# Patient Record
Sex: Female | Born: 1945 | Race: White | Hispanic: No | Marital: Single | Smoking: Never smoker
Health system: Southern US, Community
[De-identification: ages and names within clinical notes are randomized; demographics above are authoritative.]

## PROBLEM LIST (undated history)

## (undated) DIAGNOSIS — Z8639 Personal history of other endocrine, nutritional and metabolic disease: Secondary | ICD-10-CM

## (undated) DIAGNOSIS — R079 Chest pain, unspecified: Secondary | ICD-10-CM

## (undated) HISTORY — PX: DOPPLER ECHOCARDIOGRAPHY: SHX263

## (undated) HISTORY — PX: ABDOMINAL HYSTERECTOMY: SHX81

## (undated) HISTORY — PX: CHOLECYSTECTOMY: SHX55

## (undated) HISTORY — DX: Chest pain, unspecified: R07.9

## (undated) HISTORY — PX: APPENDECTOMY: SHX54

---

## 1997-10-30 ENCOUNTER — Ambulatory Visit (HOSPITAL_COMMUNITY): Admission: RE | Admit: 1997-10-30 | Discharge: 1997-10-30 | Payer: Self-pay | Admitting: Gastroenterology

## 1999-08-21 ENCOUNTER — Ambulatory Visit (HOSPITAL_BASED_OUTPATIENT_CLINIC_OR_DEPARTMENT_OTHER): Admission: RE | Admit: 1999-08-21 | Discharge: 1999-08-22 | Payer: Self-pay | Admitting: Orthopedic Surgery

## 2000-01-22 ENCOUNTER — Encounter: Payer: Self-pay | Admitting: Family Medicine

## 2000-01-22 ENCOUNTER — Encounter: Admission: RE | Admit: 2000-01-22 | Discharge: 2000-01-22 | Payer: Self-pay | Admitting: Family Medicine

## 2000-05-20 ENCOUNTER — Encounter: Admission: RE | Admit: 2000-05-20 | Discharge: 2000-05-20 | Payer: Self-pay | Admitting: Family Medicine

## 2000-05-20 ENCOUNTER — Encounter: Payer: Self-pay | Admitting: Family Medicine

## 2000-08-16 ENCOUNTER — Other Ambulatory Visit: Admission: RE | Admit: 2000-08-16 | Discharge: 2000-08-16 | Payer: Self-pay | Admitting: Family Medicine

## 2000-09-10 ENCOUNTER — Encounter: Admission: RE | Admit: 2000-09-10 | Discharge: 2000-09-10 | Payer: Self-pay | Admitting: Family Medicine

## 2000-09-10 ENCOUNTER — Encounter: Payer: Self-pay | Admitting: Family Medicine

## 2002-01-27 ENCOUNTER — Ambulatory Visit (HOSPITAL_COMMUNITY): Admission: RE | Admit: 2002-01-27 | Discharge: 2002-01-27 | Payer: Self-pay

## 2002-06-30 ENCOUNTER — Other Ambulatory Visit: Admission: RE | Admit: 2002-06-30 | Discharge: 2002-06-30 | Payer: Self-pay | Admitting: Family Medicine

## 2002-07-05 ENCOUNTER — Encounter: Payer: Self-pay | Admitting: Family Medicine

## 2002-07-05 ENCOUNTER — Encounter: Admission: RE | Admit: 2002-07-05 | Discharge: 2002-07-05 | Payer: Self-pay | Admitting: Family Medicine

## 2002-08-29 ENCOUNTER — Emergency Department (HOSPITAL_COMMUNITY): Admission: EM | Admit: 2002-08-29 | Discharge: 2002-08-29 | Payer: Self-pay | Admitting: Emergency Medicine

## 2002-12-04 ENCOUNTER — Encounter: Payer: Self-pay | Admitting: Emergency Medicine

## 2002-12-04 ENCOUNTER — Emergency Department (HOSPITAL_COMMUNITY): Admission: EM | Admit: 2002-12-04 | Discharge: 2002-12-04 | Payer: Self-pay | Admitting: Emergency Medicine

## 2003-06-19 ENCOUNTER — Encounter (INDEPENDENT_AMBULATORY_CARE_PROVIDER_SITE_OTHER): Payer: Self-pay | Admitting: Specialist

## 2003-06-19 ENCOUNTER — Ambulatory Visit (HOSPITAL_COMMUNITY): Admission: RE | Admit: 2003-06-19 | Discharge: 2003-06-19 | Payer: Self-pay

## 2004-02-22 ENCOUNTER — Encounter: Admission: RE | Admit: 2004-02-22 | Discharge: 2004-02-22 | Payer: Self-pay | Admitting: Orthopedic Surgery

## 2004-07-08 ENCOUNTER — Encounter: Admission: RE | Admit: 2004-07-08 | Discharge: 2004-07-08 | Payer: Self-pay | Admitting: Orthopedic Surgery

## 2004-09-06 ENCOUNTER — Inpatient Hospital Stay (HOSPITAL_COMMUNITY): Admission: EM | Admit: 2004-09-06 | Discharge: 2004-09-09 | Payer: Self-pay | Admitting: Emergency Medicine

## 2004-09-07 ENCOUNTER — Ambulatory Visit: Payer: Self-pay | Admitting: Oncology

## 2004-09-12 ENCOUNTER — Ambulatory Visit (HOSPITAL_COMMUNITY): Admission: RE | Admit: 2004-09-12 | Discharge: 2004-09-12 | Payer: Self-pay | Admitting: *Deleted

## 2005-06-29 ENCOUNTER — Encounter: Admission: RE | Admit: 2005-06-29 | Discharge: 2005-06-29 | Payer: Self-pay | Admitting: Orthopedic Surgery

## 2005-08-07 ENCOUNTER — Encounter: Admission: RE | Admit: 2005-08-07 | Discharge: 2005-08-07 | Payer: Self-pay | Admitting: Orthopaedic Surgery

## 2006-04-23 ENCOUNTER — Ambulatory Visit (HOSPITAL_COMMUNITY): Admission: RE | Admit: 2006-04-23 | Discharge: 2006-04-23 | Payer: Self-pay | Admitting: Family Medicine

## 2006-05-03 IMAGING — US US ABDOMEN COMPLETE
1 series · 14 of 25 positions shown · non-contrast
Comparison: none

CLINICAL DATA: Abdominal pain. 
 ABDOMEN ULTRASOUND:
TECHNIQUE: Complete abdominal ultrasound examination was performed including evaluation of the liver, gallbladder, bile ducts, pancreas, kidneys, spleen, IVC, and abdominal aorta.

[Series 1: unknown · 0.32mm/px · 14 of 82 slices shown]
[im 1/82]
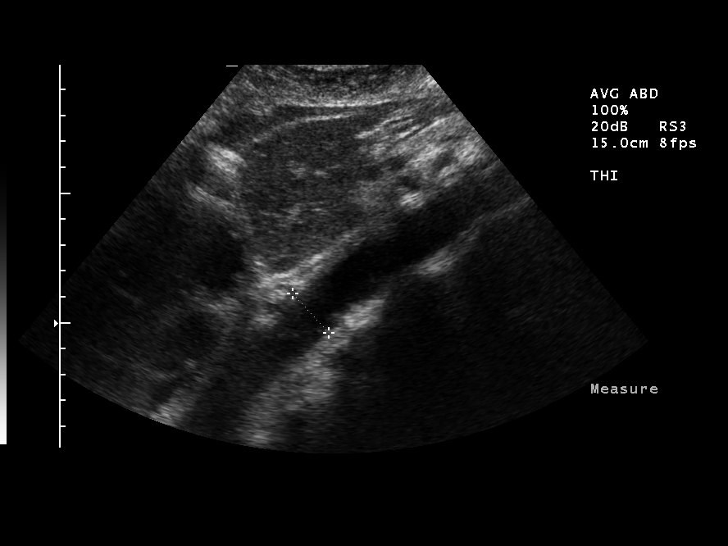
[im 7/82]
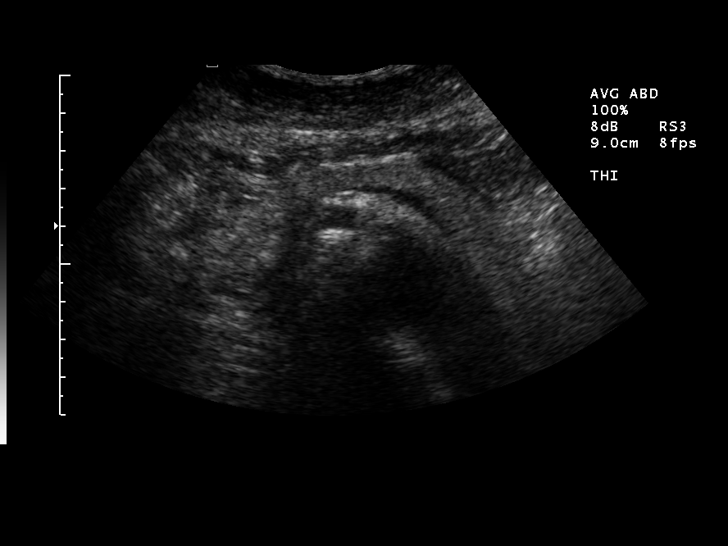
[im 14/82]
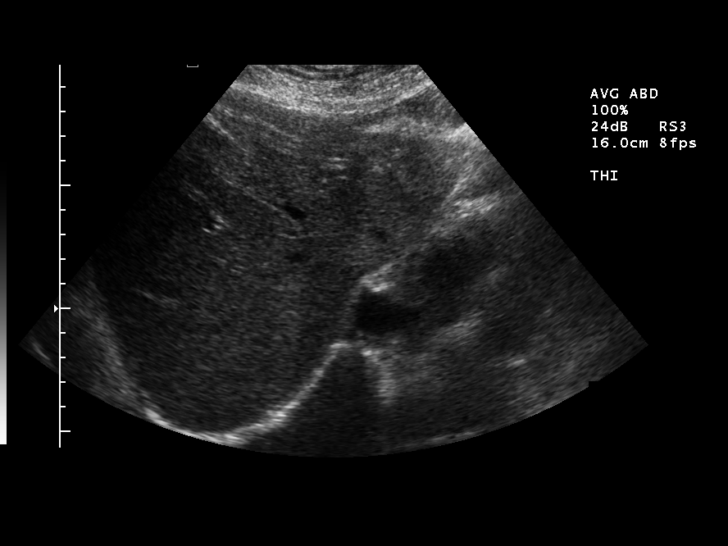
[im 21/82]
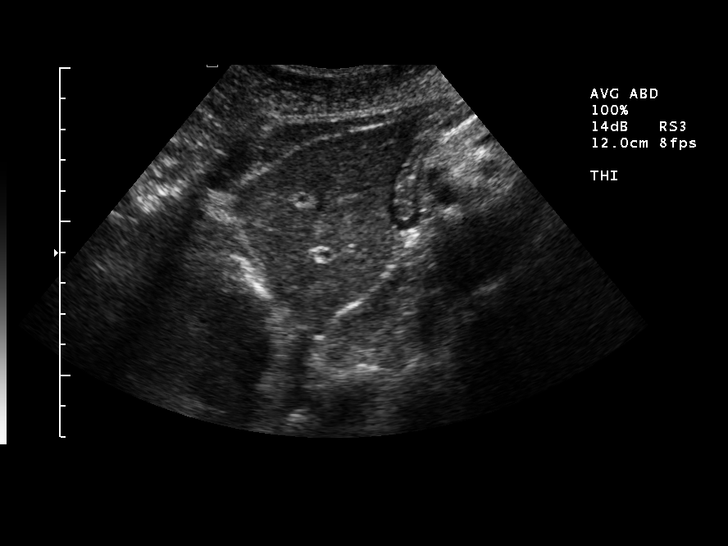
[im 28/82]
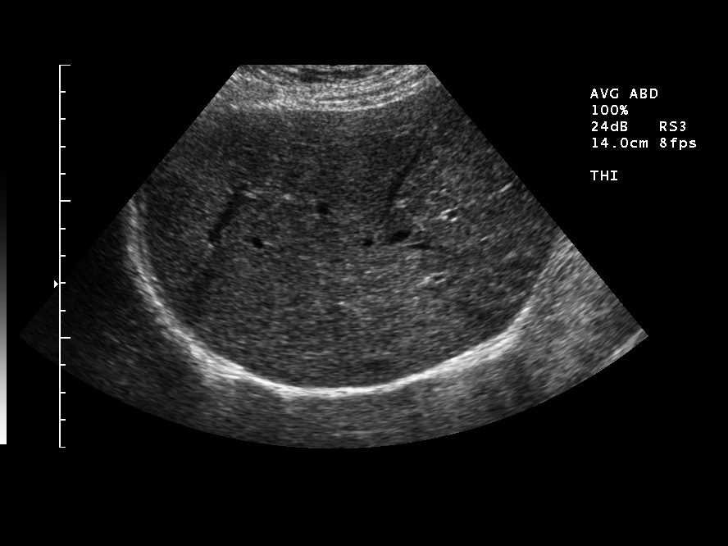
[im 31/82]
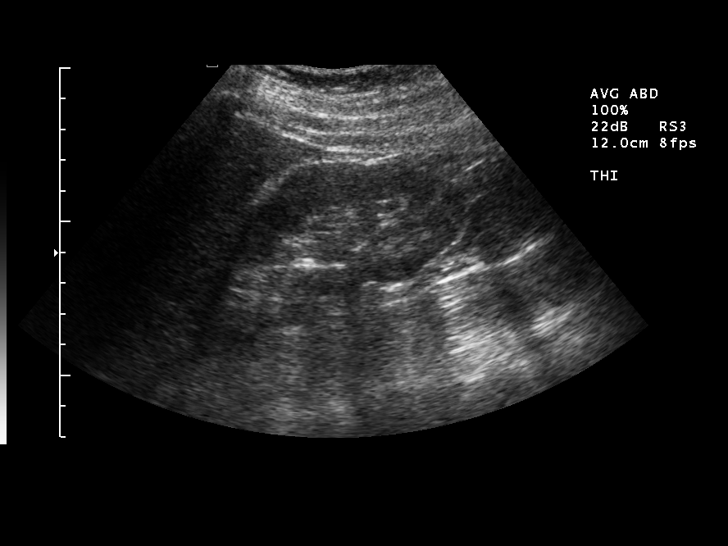
[im 38/82]
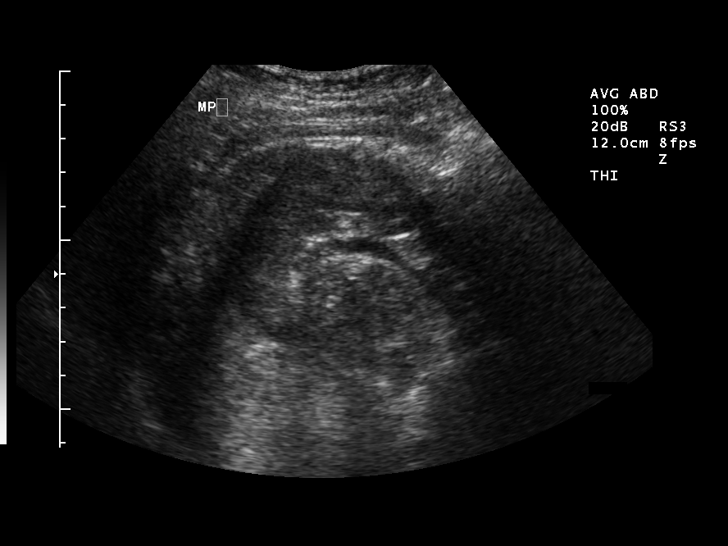
[im 44/82]
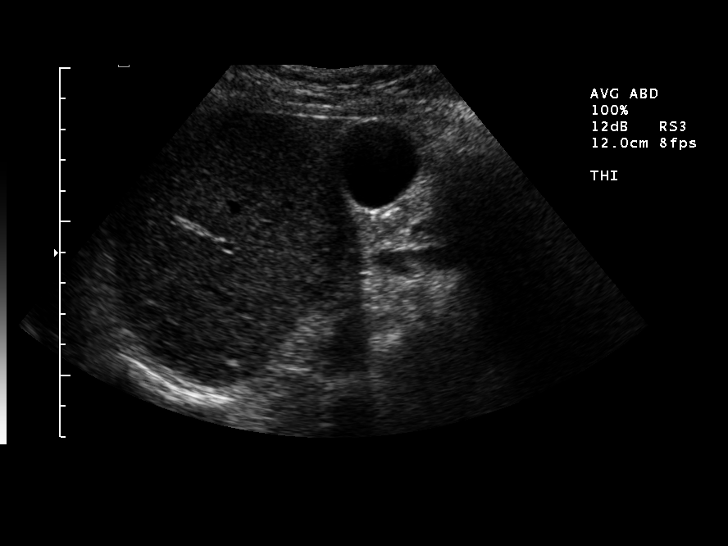
[im 51/82]
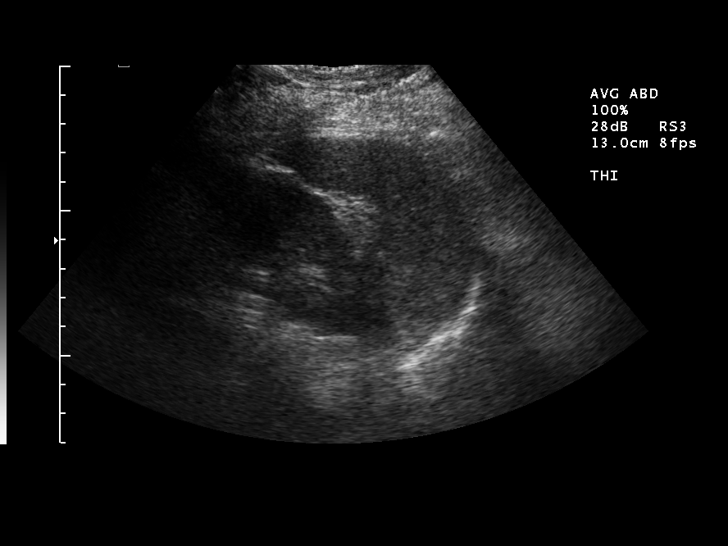
[im 55/82]
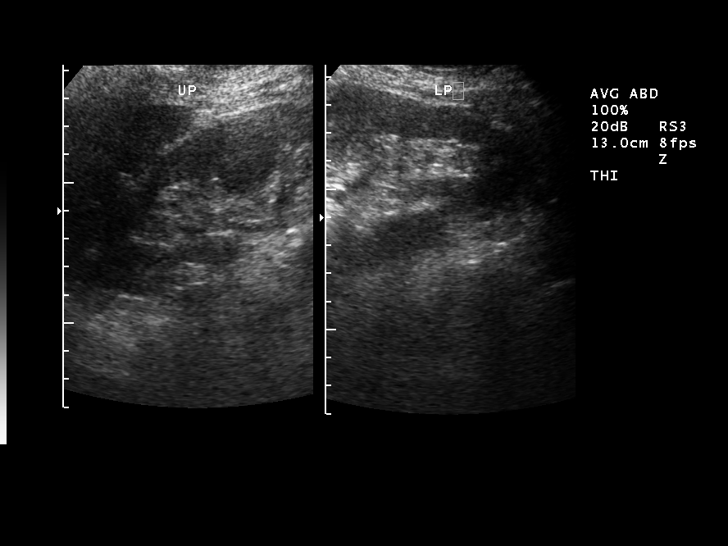
[im 61/82]
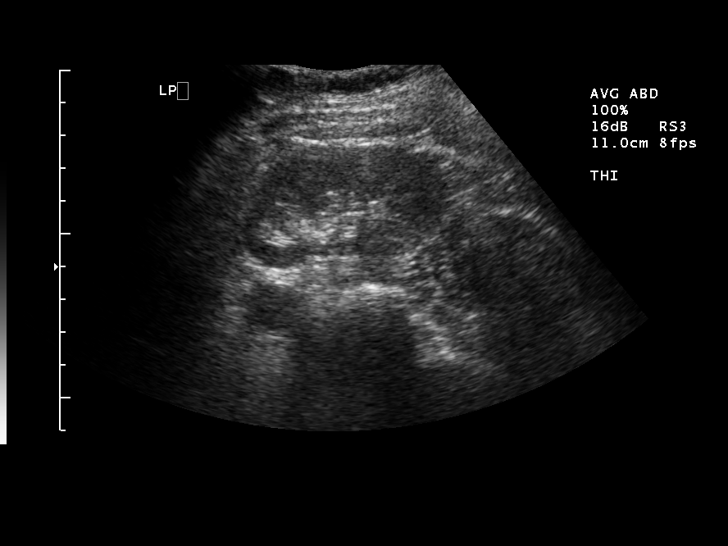
[im 68/82]
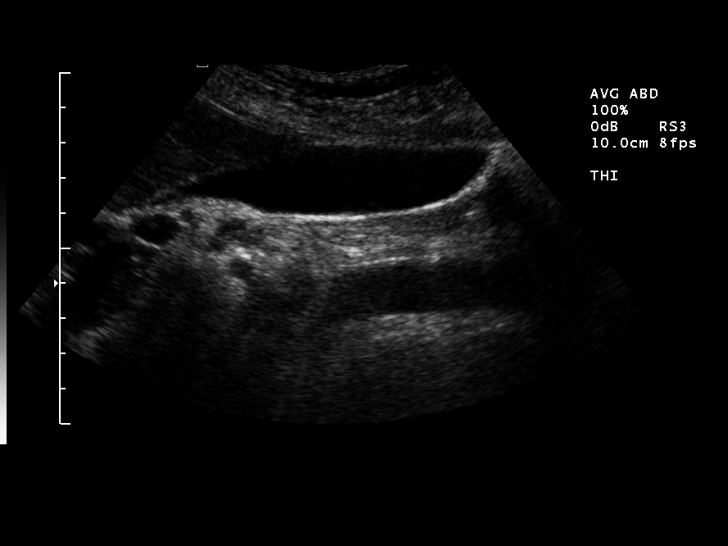
[im 75/82]
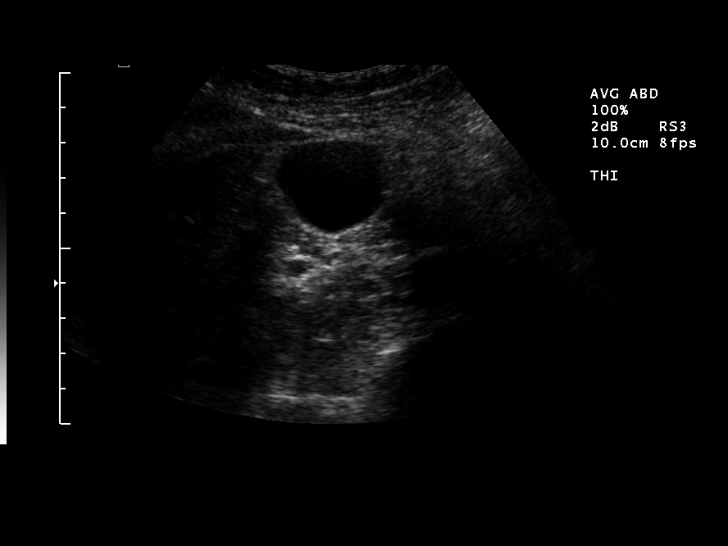
[im 82/82]
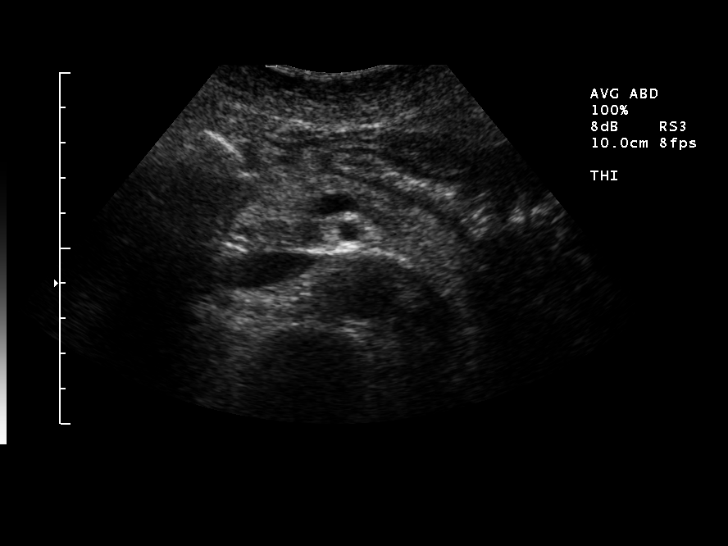

[14 of 25 positions shown; findings below may reference images not displayed]

FINDINGS: There is no evidence of gallstones or biliary ductal dilatation.  The liver is within normal limits in echogenicity, and no focal liver lesions are seen.  The visualized portions of the IVC and pancreas are unremarkable.
 There is no evidence of splenomegaly.  The kidneys are unremarkable, and there is no evidence of hydronephrosis.  The abdominal aorta is non-dilated.
IMPRESSION: Negative abdominal ultrasound.

## 2007-04-07 HISTORY — PX: OTHER SURGICAL HISTORY: SHX169

## 2007-08-27 ENCOUNTER — Encounter: Admission: RE | Admit: 2007-08-27 | Discharge: 2007-08-27 | Payer: Self-pay | Admitting: Family Medicine

## 2009-01-04 ENCOUNTER — Encounter: Admission: RE | Admit: 2009-01-04 | Discharge: 2009-01-04 | Payer: Self-pay | Admitting: Anesthesiology

## 2009-01-11 ENCOUNTER — Encounter: Admission: RE | Admit: 2009-01-11 | Discharge: 2009-01-11 | Payer: Self-pay | Admitting: Cardiology

## 2009-01-16 ENCOUNTER — Ambulatory Visit (HOSPITAL_COMMUNITY): Admission: RE | Admit: 2009-01-16 | Discharge: 2009-01-16 | Payer: Self-pay | Admitting: Internal Medicine

## 2009-01-16 HISTORY — PX: CARDIAC CATHETERIZATION: SHX172

## 2009-08-08 ENCOUNTER — Ambulatory Visit: Payer: Self-pay | Admitting: Gynecology

## 2009-08-09 ENCOUNTER — Encounter: Admission: RE | Admit: 2009-08-09 | Discharge: 2009-08-09 | Payer: Self-pay | Admitting: Orthopedic Surgery

## 2009-08-12 ENCOUNTER — Ambulatory Visit: Payer: Self-pay | Admitting: Gynecology

## 2009-08-14 ENCOUNTER — Ambulatory Visit: Payer: Self-pay | Admitting: Gynecology

## 2009-09-10 ENCOUNTER — Telehealth (INDEPENDENT_AMBULATORY_CARE_PROVIDER_SITE_OTHER): Payer: Self-pay | Admitting: *Deleted

## 2010-03-30 ENCOUNTER — Encounter: Payer: Self-pay | Admitting: Gastroenterology

## 2010-03-30 ENCOUNTER — Encounter: Payer: Self-pay | Admitting: Family Medicine

## 2010-04-08 NOTE — Progress Notes (Signed)
Summary: Dionisio David to Accept   Phone Note Outgoing Call Call back at [facility] Phone [phone]   Call placed by: Harlow Mares CMA Duncan Dull),  September 10, 2009 10:57 AM Call placed to: Patient Summary of Call: Called pt and advised her at this time Dr. Juanda Chance declined to accept the patient into her practice. Patient has seen Dr. Madilyn Fireman, Bosie Clos, and [place]. Patient will come pick up her records and see if a GI in [place] will accept her.  Initial call taken by: Harlow Mares CMA (AAMA),  September 10, 2009 10:59 AM

## 2010-04-29 ENCOUNTER — Encounter (HOSPITAL_COMMUNITY)
Admission: RE | Admit: 2010-04-29 | Discharge: 2010-04-29 | Disposition: A | Discharge status: Home / Self Care | Payer: PRIVATE HEALTH INSURANCE | Source: Ambulatory Visit | Attending: Orthopedic Surgery | Admitting: Orthopedic Surgery

## 2010-04-29 ENCOUNTER — Other Ambulatory Visit (HOSPITAL_COMMUNITY): Payer: Self-pay | Admitting: Orthopedic Surgery

## 2010-04-29 DIAGNOSIS — Z01818 Encounter for other preprocedural examination: Secondary | ICD-10-CM | POA: Insufficient documentation

## 2010-04-29 DIAGNOSIS — Z01811 Encounter for preprocedural respiratory examination: Secondary | ICD-10-CM

## 2010-04-29 DIAGNOSIS — Z01812 Encounter for preprocedural laboratory examination: Secondary | ICD-10-CM | POA: Insufficient documentation

## 2010-04-29 LAB — URINALYSIS, ROUTINE W REFLEX MICROSCOPIC
Ketones, ur: NEGATIVE mg/dL
Specific Gravity, Urine: 1.016 (ref 1.005–1.030)
Urobilinogen, UA: 0.2 mg/dL (ref 0.0–1.0)

## 2010-04-29 LAB — COMPREHENSIVE METABOLIC PANEL
ALT: 11 U/L (ref 0–35)
Alkaline Phosphatase: 97 U/L (ref 39–117)
CO2: 26 mEq/L (ref 19–32)
Chloride: 107 mEq/L (ref 96–112)
Creatinine, Ser: 0.92 mg/dL (ref 0.4–1.2)
GFR calc Af Amer: >60 mL/min (ref >60)
Sodium: 140 mEq/L (ref 135–145)
Total Bilirubin: 0.3 mg/dL (ref 0.3–1.2)

## 2010-04-29 LAB — CBC
MCHC: 34.5 g/dL (ref 30.0–36.0)
Platelets: 199 10*3/uL (ref 150–400)
RDW: 12.7 % (ref 11.5–15.5)

## 2010-04-29 LAB — PROTIME-INR: INR: 0.93 (ref 0.00–1.49)

## 2010-04-29 LAB — APTT: aPTT: 30 seconds (ref 24–37)

## 2010-05-05 ENCOUNTER — Ambulatory Visit (HOSPITAL_COMMUNITY): Payer: PRIVATE HEALTH INSURANCE

## 2010-05-05 ENCOUNTER — Ambulatory Visit (HOSPITAL_COMMUNITY)
Admission: RE | Admit: 2010-05-05 | Discharge: 2010-05-05 | Disposition: A | Discharge status: Home / Self Care | Payer: PRIVATE HEALTH INSURANCE | Source: Ambulatory Visit | Attending: Orthopedic Surgery | Admitting: Orthopedic Surgery

## 2010-05-05 DIAGNOSIS — M1610 Unilateral primary osteoarthritis, unspecified hip: Secondary | ICD-10-CM | POA: Insufficient documentation

## 2010-05-05 DIAGNOSIS — M169 Osteoarthritis of hip, unspecified: Secondary | ICD-10-CM | POA: Insufficient documentation

## 2010-05-05 DIAGNOSIS — I10 Essential (primary) hypertension: Secondary | ICD-10-CM | POA: Insufficient documentation

## 2010-05-09 NOTE — Op Note (Signed)
  [name], [name]                   ACCOUNT NO.:  [id number]  MEDICAL RECORD NO.:  [id number]           PATIENT TYPE:  O  LOCATION:  SDSC                         FACILITY:  [facility]  PHYSICIAN:  Loreta Ave, M.D. DATE OF BIRTH:  07-20-1945  DATE OF PROCEDURE:  05/05/2010 DATE OF DISCHARGE:  05/05/2010                              OPERATIVE REPORT   PREOPERATIVE DIAGNOSIS:  Right hip moderate degenerative arthritis with questionable labrum tear.  POSTOPERATIVE DIAGNOSES:  Right hip moderate degenerative arthritis with questionable labrum tear with some grade II and III changes, especially anterior and tearing of the superior and anterior labrum.  PROCEDURE:  Right hip examined under anesthesia, arthroscopy with chondroplasty, removal of loose bodies and debridement of labrum.  SURGEON:  Loreta Ave, M.D.  ASSISTANT:  Genene Churn. Barry Dienes, P.A. present throughout the entire case and necessary for timely completion of procedure.  ANESTHESIA:  General  BLOOD LOSS:  Minimal.  SPECIMEN:  None.  CULTURES:  None.  COMPLICATIONS:  None.  DRESSING:  Soft compressive.  PROCEDURE:  The patient was brought to the operating room and after adequate anesthesia had been obtained placed on the fracture table, lateral position, appropriate padding and support.  I confirmed that I could distract the hip adequately with arthroscopy.  Traction removed. Prepped and draped in usual sterile fashion.  We then sequentially placed sufficient traction to open up the hip 1 cm, confirmed with fluoroscopic guidance.  Two portals, anterolateral and posterolateral. Utilizing first a spinal needle, then a guidewire, then an enlarging trocar, 2 cannulas were placed into the hip.  Looking from both sides and switching the shaver from the front to the back as well as the camera, I could visualize the entire hip.  Chondral loose bodies and debris removed.  Most of the acetabulum looked good in the back  and medial.  Anterior superior some grade II and III changes, loose bodies. Infolding tearing of the anterior labrum as well as the anterosuperior. Debrided back to stable surface.  All loose fragments removed.  Entire hip examined.  No other findings appreciated.  Instruments and fluid were removed after the hip was injected with Marcaine and Depo-Medrol.  Once all instruments were out, we removed all traction.  Confirmed nice contour and reduction.  Wounds were closed with nylon.  Sterile compressive dressing applied.  Returned to supine position.  Anesthesia reversed.  Brought to recovery room.  Tolerated surgery well.  No complications.     Loreta Ave, M.D.     DFM/MEDQ  D:  05/05/2010  T:  05/06/2010  Job:  [id number]  Electronically Signed by Mckinley Jewel M.D. on 05/09/2010 09:34:32 AM

## 2010-05-15 ENCOUNTER — Ambulatory Visit
Admission: RE | Admit: 2010-05-15 | Discharge: 2010-05-15 | Disposition: A | Discharge status: Home / Self Care | Payer: PRIVATE HEALTH INSURANCE | Source: Ambulatory Visit | Attending: Orthopedic Surgery | Admitting: Orthopedic Surgery

## 2010-05-15 ENCOUNTER — Other Ambulatory Visit: Payer: Self-pay | Admitting: Orthopedic Surgery

## 2010-05-15 DIAGNOSIS — K37 Unspecified appendicitis: Secondary | ICD-10-CM

## 2010-05-15 MED ORDER — IOHEXOL 300 MG/ML  SOLN
100.0000 mL | Freq: Once | INTRAMUSCULAR | Status: AC | PRN
  Administered 2010-05-15: 100 mL via INTRAVENOUS

## 2010-07-25 NOTE — Op Note (Signed)
NAME:  [name], [name]                             ACCOUNT NO.:  [id number]   MEDICAL RECORD NO.:  [id number]                   PATIENT TYPE:  AMB   LOCATION:  ENDO                                 FACILITY:  [facility]   PHYSICIAN:  John C. Madilyn Fireman, M.D.                 DATE OF BIRTH:  [birth year]   DATE OF PROCEDURE:  06/19/2003  DATE OF DISCHARGE:                                 OPERATIVE REPORT   PROCEDURE:  Colonoscopy.   INDICATION FOR PROCEDURE:  Unexplained anemia.   PROCEDURE:  The patient was placed in the left lateral decubitus position  and placed on the pulse monitor with continuous low-flow oxygen delivered by  nasal cannula.  She was sedated with 2.5 mg of IV Versed in addition to the  medicine given for the previous EGD.  The Olympus video colonoscope was  inserted into the rectum and advanced to the cecum, confirmed by  transillumination of McBurney's point and visualization of the ileocecal  valve and the appendiceal orifice.  The prep was excellent.  The cecum,  ascending, transverse, descending, and sigmoid colon all appeared normal  with no masses, polyps, diverticula, or other mucosal abnormalities.  The  rectum likewise appeared normal and retroflexed view of the anus revealed no  obviously enlarged internal hemorrhoids.  The scope was then withdrawn and  the patient returned to the recovery room in stable condition.  She  tolerated the procedure well, and there were no immediate complications.   IMPRESSION:  Normal colonoscopy.   PLAN:  Await small bowel biopsies to rule out celiac disease as a cause of  her anemia.                                               John C. Madilyn Fireman, M.D.    JCH/MEDQ  D:  06/19/2003  T:  06/19/2003  Job:  [id number]   cc:   Talmadge Coventry, M.D.  [address]  [place]  [place] [address]  Fax: [phone]

## 2010-07-25 NOTE — Consult Note (Signed)
NAMESERRITA, LUETH                   ACCOUNT NO.:  [id number]   MEDICAL RECORD NO.:  [id number]          PATIENT TYPE:  INP   LOCATION:  1402                         FACILITY:  [facility]   PHYSICIAN:  Leighton Roach. Truett Perna, M.D. DATE OF BIRTH:  Nov 19, 1945   DATE OF CONSULTATION:  DATE OF DISCHARGE:                                   CONSULTATION   REFERRED BY:  Dr. Corky Downs, [facility].   PATIENT IDENTIFICATION:  Ms. Pennella is a 65 year old admitted with right  abdomen/flank pain.  The admission laboratory evaluation was remarkable for  severe thrombocytopenia.   HISTORY OF PRESENT ILLNESS:  Ms. Erdmann reports a 2-3 week history of right  abdomen/flank pain.  This began [place]  She has seen Dr. Smith Mince on several occasions for evaluation  of this pain and reports undergoing a negative CT scan at [facility].  She was placed on ciprofloxacin earlier this week when the pain  persisted and there was associated diarrhea.  She presented to the emergency  room today with persistent pain.   Ms. Checo reports a chronic history of anemia.  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  She has been evaluated by Dr. Lorenda Cahill in the past,  including a bone marrow biopsy.  This was 15-20 years ago.  She also reports  being evaluated by Dr. Cyndie Chime.  She is maintained on vitamin B12  injections chronically.  She reports no history of thrombocytopenia.   Her only new recent medications are a pain medication prescribed by Dr.  Smith Mince for arthritis and ciprofloxacin given earlier this week.  She has  used an over the counter preparation for leg cramps chronically.  She uses  this approximately 4-5 nights per month.   PAST MEDICAL HISTORY:  1.  Irritable bowel syndrome.  2.  G3 P 2, one miscarriage.  3.  History of sickle cell trait.   PAST SURGICAL HISTORY:  Hysterectomy approximately 30 years ago.   CURRENT MEDICATIONS:  1.  Xanax.  2.  Carafate.  3. Nexium.  4. Over the counter medication for      leg cramps.  5. Pain medication..   ALLERGIES:  Valium.   FAMILY HISTORY:  No family history of a hematologic condition to her  knowledge.  Her mother died from colon cancer at age 41.  Her father died  from lung cancer at age 15.  A brother died of cancer at age of 60.  She  does not know a specific cancer type.   SOCIAL HISTORY:  She lives alone in [place].  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  She knows of no unusual exposures to chemicals  or radiation.  She has not used tobacco or alcohol.  She was transfused 30  years ago at the time of surgery following childbirth.  She has no history  of hepatitis.  She denies risk factors for sexually transmitted diseases  including hepatitis and HIV.   REVIEW OF SYSTEMS:  Constitutional:  She believes she may  have had a low  grade fever intermittently for the past two weeks.  She has noted sweats at  the neck and upper body at night for the past few weeks.  She reports a 10  pound weight loss over the past few weeks.  Respiratory:  Negative.  Cardiac:  Negative.  GU:  She reports urinary frequency.  She noted blood  when wiping after urinating at Dr. Stephannie Peters office recently.  Skin:  Positive for pruritus.  Neurologic:  She has headaches.  No focal neurologic  symptoms.  No visual changes.  Hematologic:  She has bruised easily  chronically.  There has been no other bleeding.  Musculoskeletal:  No pain  aside from the right abdomen/flank pain.  She describes this pain as  intermittent and a cramping like pain.   PHYSICAL EXAMINATION:  Temperature 98, pressure 154/64, pulse 96, oxygen  saturation 99% on room air.  HEENT:  Sclerae anicteric.  Upper and lower  denture plate.  There are two petechiae in the mouth.  No active bleeding.  Neck:  Without mass.  Lungs:  Clear. Cardiac:  Regular rhythm.  No gallop.  A 2/6 systolic murmur.  Abdomen:  No  hepatosplenomegaly.  There is  tenderness at the right lateral abdominal wall at the inferior edge of the  ribcage.  The tenderness appears to be over the muscle and fat as opposed to  the ribcage.  No palpable mass.  Extremities:  No edema.  Neurologic:  She  is alert and oriented.  Motor exam is grossly intact.  Skin:  There are a  few resolving, small ecchymoses at the upper thighs and upper arms.  No  petechiae on the arms or legs.  Lymph nodes:  No palpable cervical,  clavicular, axillary or inguinal lymph nodes.   LABORATORY DATA:  Hemoglobin 10.6, hematocrit 30.6%, MCV 95.6, platelets  10,000, white count 4.2.  ANC 1.4.  Absolute lymphocyte count 2.7.  PT 13.4.  PTT 32.  Sodium 140, potassium 3.3.  BUN 10, creatinine 0.9.  Bilirubin 0.7.  Calcium 9.5, albumin 4.1, LDH 151, lipase 34.  Urinalysis negative for blood  and leukocytes.  Blood type O negative.  Antibody screen negative.   CBC from August 29, 2002:  Hemoglobin 10.6, platelets 209,000, MCV 91.3, white  count 6.4, ANC 2.5.   Review of the peripheral blood smear:  The platelets are markedly decreased  in number.  I saw only 1 or 2 platelets on review of the entire blood smear.  No obvious platelet clumps.  The white cell morphology is unremarkable.  No  blasts.  The white cells are variable.  There are a few target cells,  teardrops, and ovalocytes.  Some of the red cells are microcytic.  The  polychromasia is not increased.   IMPRESSION:  1.  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  2.  Chronic anemia.  3.  Right abdomen/flank pain- ?etiology.  4.  Report of having sickle cell trait.   The severe thrombocytopenia is most likely secondary to ITP.  She reports  taking an over the counter medication for leg cramps at night.  This most  likely contains quinine and the thrombocytopenia could be related to the use  of quinine.  The differential diagnosis includes an infiltrative bone marrow process such  as  acute leukemia or a high grade lymphoma.  I believe this is unlikely  given the stable hemoglobin and normal white count.   The anemia is chronic.  Sickle cell trait should not cause anemia.  We will  follow up on records from a previous evaluation of the anemia.  The  differential diagnosis includes myelodysplasia.   I cannot relate the pain/tenderness at the right abdomen and flank to the  hematologic findings.  Preliminary review of the CT scan is negative for an  acute process.  The pain could be related to a benign musculoskeletal  condition, a GI process, a renal stone, or referred pain from the spine.   RECOMMENDATIONS:  1.  Repeat platelet count in a citrate tube to rule out      pseudothrombocytopenia.  2.  Begin Solu-Medrol as empiric treatment for ITP.  3.  Treat with IVIG and/or a platelet transfusion for bleeding or if the      platelet count falls.  4.  Bone marrow biopsy if the platelet count does not respond to therapy      within the next 24-48 hours.  5.  Obtain recent lab data from Dr. Stephannie Peters office.       GBS/MEDQ  D:  09/06/2004  T:  09/07/2004  Job:  [id number]   cc:   Mobolaji B. Corky Downs, M.D.   Talmadge Coventry, M.D.  [address]  [place]  [place] [address]  Fax: [phone]

## 2010-07-25 NOTE — Op Note (Signed)
. [facility]  Patient:    [name], [name]                          MRN: [id number] Proc. Date: 08/21/99 Adm. Date:  [id number] Disc. Date: [id number] Attending:  Colbert Ewing                           Operative Report  PREOPERATIVE DIAGNOSIS:  Attritional rotator cuff tear, right shoulder, with chronic impingement.  POSTOPERATIVE DIAGNOSES:  Attritional rotator cuff tear, right shoulder, with chronic impingement with anterior labrum tear and subluxation, long head, biceps tendon.  PROCEDURE:  Right shoulder exam under anesthesia, arthroscopy, debridement of labrum with arthroscopic acromioplasty and coracoacromial ligament release. Open repair of rotator cuff tear with reanchoring of biceps tendon and repair of anterior and posterior rotator cuff interval lesion.  SURGEON:  Loreta Ave, M.D.  ASSISTANT:  Arlys John D. Petrarca, P.A.-C.  ANESTHESIA:  General.  BLOOD LOSS:  Minimal.  SPECIMENS:  None.  CULTURES:  None.  COMPLICATIONS:  None.  DRESSINGS:  Soft compressive.  DESCRIPTION OF PROCEDURE:  Patient was brought to the operating room and placed on the operating table in supine position.  After adequate anesthesia had been obtained, right shoulder examined, full motion, stable shoulder. Placed in a beach-chair position on the shoulder positioner and area prepped and draped in the usual sterile fashion.  Three standard arthroscopic portals, anterior and posterolateral.  Shoulder entered with blunt obturator, distended and inspected.  Articular cartilage intact as well as capsular ligamentous structures.  Attritional tearing, anterior labrum, debrided with a shaver. Biceps and biceps anchor intact.  Complete tearing of supraspinatus tendon with minimal retraction and reasonable tissue quality.  Subluxation of long head of biceps tendon due to anterior cable portion of supraspinatus tear. After debridement of the anterior  structures, cannula redirected subacromially.  Cuff debrided from above.  Chronic impingement with type 3 acromion, converted to a type 1 acromion with shaver and high-speed bur.  CA ligament released.  Distal clavicle was not symptomatic at the joint and did not contribute to impingement and was left intact.  Instrument and fluids removed.  Lateral portal opened into a deltoid splitting incision; skin and subcutaneous tissues and deltoid divided.  Subacromial space approached. Adequacy of decompression confirmed.  Rotator cuff identified.  Trough created in the humerus adjacent to the tuberosity.  A series of nonabsorbable #2 Ethibond sutures were woven into the cuff from anterior to posterior extent of the repair.  Series of drill holes made in the tuberosity trough with the Concept repair system.  Sutures tied over bone after they were brought through the drill holes.  Most anterior aspects were placed right behind the biceps tendon to correct the posterior subluxation of the long head of the biceps. Once sutures were tied, the anterior and posterior tears, which were interval lesions at the front and in the back of the supraspinatus tendon, were repaired with nonabsorbable suture.  At completion, nice watertight closure without undue tension through full passive motion.  Adequacy of decompression confirmed digitally.  Wound irrigated.  Deltoid closed with Vicryl, skin and subcutaneous tissue with a subcutaneous and subcuticular Vicryl.  Portals closed with nylon.  Margins of wound were injected with Marcaine as was the shoulder and bursa.  Sterile compressive dressing and sling applied. Anesthesia reversed and brought to recovery room.  Tolerated surgery well.  No complications.  DD:  08/21/99 TD:  08/25/99 Job: [id number]

## 2010-07-25 NOTE — Discharge Summary (Signed)
[name], [name]                   ACCOUNT NO.:  [id number]   MEDICAL RECORD NO.:  [id number]          PATIENT TYPE:  INP   LOCATION:  1402                         FACILITY[facility]   PHYSICIAN:  Mallory Shirk, MD     DATE OF BIRTH:  Jul 06, 1945   DATE OF ADMISSION:  09/06/2004  DATE OF DISCHARGE:                                 DISCHARGE SUMMARY   DISCHARGE DIAGNOSES:  1.  Irritable bowel syndrome.  2.  Abdominal pain secondary to #1.   DISCHARGE MEDICATIONS:  1.  Librax one capsule p.o. t.i.d.  2.  Metronidazole 500 mg p.o. q.i.d. to end September 17, 2004.  3.  Protonix 40 mg p.o. daily.  4.  Sucralfate 1 gm p.o. t.i.d.   FOLLOW UP APPOINTMENTS:  1.  With Talmadge Coventry, M.D., primary care physician, within 1 week of      discharge.  2.  With Dorena Cookey, M.D., gastroenterology, as scheduled before.   HISTORY OF PRESENT ILLNESS:  [name] is a pleasant 65 year old Caucasian  woman with a history of irritable bowel syndrome and chronic anemia who  presented to the emergency department at [facility] on September 06, 2004 with  complaints of right upper quadrant pain for about 3-4 weeks.  The pain was  sharp.  She was evaluated with a CT scan of the pelvis and abdomen, which  was normal per patient.  The CT scan was done by Dr. Talmadge Coventry.  The pain did not go away.  There was some associated diarrhea.  The patient  had 4 bowel movements 2 days prior to admission, and 3 bowel movements with  watery stool the day prior to admission.  The patient denies any fevers, but  she has been having chills.  No nausea or vomiting.  The patient states that  Dr. Smith Mince had started her on ciprofloxacin.   On evaluation in the ER, the patient was found to have platelets of 10,000.  She admitted that she has been having some unexplained bruises on her limbs.  No epistaxis, no hemoptysis or hematochezia.  Denies any family history of  blood dyscrasia.  Several years ago, the patient was seen  for chronic  anemia, and had a bone marrow done; she does not know the results of this.   PAST MEDICAL HISTORY:  1.  Irritable bowel syndrome.  2.  Chronic anemia.   MEDICATIONS ON ADMISSION:  1.  Xanax p.r.n.  2.  Carafate.  3.  Nexium.   ALLERGIES:  VALIUM causes hypertension during anesthesia.   PHYSICAL EXAMINATION ON ADMISSION:  VITAL SIGNS:  Blood pressure is 164/77,  pulse 71, respiratory rate 20, O2 saturations 100% on room air, temperature  98.5.  GENERAL:  A pleasant middle-aged Caucasian woman in no acute distress.  HEENT:  Normocephalic and atraumatic.  Pupils equal, round and reactive to  light.  Sclerae anicteric.  Mucous membranes moist.  Extraocular muscles  intact.  NECK:  Supple.  No LAD, no JVD.  LUNGS:  Clear to auscultation bilaterally.  No wheezes, no rales.  CARDIOVASCULAR:  S1 and S2.  Regular rate and rhythm.  No murmurs, rubs, or  gallops.  ABDOMEN:  Mild right upper quadrant tenderness.  No rebound no guarding.  Soft abdomen.  Positive bowel sounds.  No organomegaly.  EXTREMITIES:  No pedal edema.  No calf tenderness.  NEUROLOGIC:  Nonfocal.   LABORATORY DATA:  Sodium 140, potassium 3.3, chloride 105, carbon dioxide  25, glucose 91, BUN 10, creatinine 0.9, calcium 9.5, and WBC's 4.2,  hemoglobin 10.6, hematocrit 30.6, MCV 95.6, platelets 10,000 with a  differential of neutrophils 33% and lymphocytes 64% with atypical  lymphocytes.  No schistocytes or blasts seen on the smear.  Total bilirubin  0.7, direct bilirubin 0.1, indirect 0.6.  ALT 90, AST 20, ALT 7.  Total  protein 6.9, albumin 4.1, lipase 34.  CT scan of the abdomen showed no  evidence for acute abnormality of the abdomen.  CT scan of the pelvis  revealed no evidence for acute abnormality.  Moderate stool within the  cecum.  Ultrasound of the abdomen negative.  Small bowel follow through -  there was no abnormality.  Cecum located in the right pelvis; therefore, the  terminal ileum is not  optimally visualized.  No areas of obstruction seen.  Focal area of sclerosis on the right bone seen, probably represents a bone  island.   HOSPITAL COURSE:  The patient was admitted to a monitored bed because of her  profound thrombocytopenia.  1.  Thrombocytopenia.  The patient was seen by Dr. Thornton Papas of      hematology/oncology.  Repeat CBC with citrate tube showed a platelet      count to be normal.  No further work-up for the thrombocytopenia is      planned at this time.  2.  Irritable bowel syndrome.  As described above, imaging work-up was      negative.  The patient states that she has had similar episodes before;      however, not of these have lasted for this long.  A discussion was held      with Dr. Leary Roca, [facility].  The patient has had a colonoscopy and an      EGD in April of 2005 by Dr. Dorena Cookey of [facility].  These studies have      been normal.  Dr. Ewing Schlein felt that the patient would not have benefited      significantly by a colonoscopy at this time.  His recommendation was to      try Librax and see if the patient gets relief from them.  Bentyl was      also tried which did not give the patient any relief.  Stool WBC's were      negative.  A Clostridium difficile toxin is pending.  Celiac antibody      has also been ordered.  The patient was able to tolerate p.o.  She      continued to have loose bowel movements.  Her abdominal pain was      persistent.  The plan is to see if the patient gets some relief from      Librax.  If the patient has persistent pain, then Dr. Ewing Schlein will come      see the patient and possibly do another colonoscopy.  3.  Chronic anemia.  The patient's anemia was stable during the hospital      stay.  No episodes of bleeding on the date of this dictation.  Hemoglobin and hematocrit was 11.3 and 32.4.   The patient will be discharged in stable condition with follow up with Dr. Dorena Cookey and Dr. Talmadge Coventry.        GDK/MEDQ  D:  09/08/2004  T:  09/08/2004  Job:  [id number]   cc:   Everardo All. Madilyn Fireman, M.D.  [address], [address]  [place]  [place] [address]  Fax: [phone]   Talmadge Coventry, M.D.  [address]  [place]  [place] [address]  Fax: [phone]

## 2010-07-25 NOTE — Op Note (Signed)
NAME:  [name], [name]                             ACCOUNT NO.:  [id number]   MEDICAL RECORD NO.:  [id number]                   PATIENT TYPE:  AMB   LOCATION:  ENDO                                 FACILITY:  [facility]   PHYSICIAN:  John C. Madilyn Fireman, M.D.                 DATE OF BIRTH:  March 15, 1945   DATE OF PROCEDURE:  06/19/2003  DATE OF DISCHARGE:                                 OPERATIVE REPORT   PROCEDURE PERFORMED:  Esophagogastroduodenoscopy with biopsy.   ENDOSCOPIST:  Barrie Folk, M.D.   INDICATIONS FOR PROCEDURE:  Unexplained anemia.   DESCRIPTION OF PROCEDURE:  The patient was placed in the left lateral  decubitus position and placed on the pulse monitor with continuous low-flow  oxygen delivered by nasal cannula.  She was sedated with 75 mcg IV fentanyl  and 7.5 mg IV Versed.  The Olympus video endoscope was advanced under direct  vision into the oropharynx and esophagus.  The esophagus was straight and of  normal caliber with the squamocolumnar line at 38 cm.  There was no visible  hiatal hernia, ring, stricture or other abnormality of the gastroesophageal  junction.  The stomach was entered and a small amount of liquid secretions  was suctioned from the fundus.  A retroflex view of the cardia was  unremarkable.  The fundus, body, antrum and pylorus all appeared normal.  The duodenum was entered and both the bulb and second portion were well  inspected and appeared to be within normal limits.  Biopsies were taken of  the distal duodenum to rule out sprue.  The scope was then withdrawn and the  patient prepared for colonoscopy.  The patient tolerated the procedure well.  There were no immediate complications.   IMPRESSION:  Normal endoscopy, status post duodenal biopsies to rule out  causes of anemia.   PLAN:  Proceed with colonoscopy.                                               John C. Madilyn Fireman, M.D.    JCH/MEDQ  D:  06/19/2003  T:  06/19/2003  Job:  [id number]   cc:    Talmadge Coventry, M.D.  [address]  [place]  [place] [address]  Fax: [phone]

## 2010-07-25 NOTE — H&P (Signed)
[name], [name]                   ACCOUNT NO.:  [id number]   MEDICAL RECORD NO.:  [id number]          PATIENT TYPE:  EMS   LOCATION:  ED                           FACILITY:  [facility]   PHYSICIAN:  Mobolaji B. Bakare, M.D.DATE OF BIRTH:  10-31-1945   DATE OF ADMISSION:  09/06/2004  DATE OF DISCHARGE:                                HISTORY & PHYSICAL   PRIMARY CARE PHYSICIAN:  Talmadge Coventry, M.D.   CHIEF COMPLAINT:  Right upper quadrant pain.   HISTORY OF PRESENT ILLNESS:  [name] is a pleasant 65 year old Caucasian  female with history of irritable bowel syndrome and chronic anemia. She has  been having right upper quadrant pain, nonradiating for about three weeks.  It is sharp in nature. She was evaluated with a CAT scan of the abdomen and  pelvis. As per patient, this was normal but this pain is not going away.  There is associated diarrhea. She had about four bowel movements two days  ago and three bowel movements yesterday. The consistency is from loose to  watery. She denies fevers but she has been having some chills. No vomiting  and no nausea.   On evaluation in the ER, she was found to have a low platelet of 10,000. She  admitted that she has been having some unexplained bruises on her limbs and  thighs. No epistaxis, no hemoptysis, or hematochezia. She denies any family  history of blood dyscrasia. She was evaluated several years ago for chronic  unexplained anemia and cause was unknown. She was seen by Dr. Rennis Harding in April  2005 for chronic anemia. She had upper and lower endoscopy. Results were  within normal limits. These current abdominal pain is different from the  crampy feeling she gets with IBS.   REVIEW OF SYSTEMS:  She admits to having headaches but this is frontal and  longstanding on and off. There has been no change in the intensity. No  change in her vision. She does have some lightheadedness. She feels heart  flutters occasionally. No shortness of breath,  no cough, no dysuria,  urgency, or increase in micturition. She has lost three to five pounds in  one month.   PAST MEDICAL HISTORY:  1.  Irritable bowel syndrome.  2.  Chronic anemia.   PAST SURGICAL HISTORY:  Right shoulder arthroscopy for rotator cuff tear.   CURRENT MEDICATIONS:  Xanax, Carafate, Nexium.   ALLERGIES:  VALIUM causes hypertension during anesthesia.   FAMILY HISTORY:  No family history of blood dyscrasia.   SOCIAL HISTORY:  She is married and lives with her husband. She does not  smoke cigarettes and does not drink alcohol. She is independent in  activities of daily living.   PHYSICAL EXAMINATION:  VITAL SIGNS:  Blood pressure 164/77, pulse 71,  respiratory rate 20. O2 saturation of 100% on room air. Temperature of 98.5.  HEENT:  She is not pale, anicteric. Pupils are equal, round, and reactive to  light. Extraocular movements intact.  NECK:  No carotid bruit. No elevated JVD. No thyromegaly.  LUNGS:  Clear clinically to  auscultation.  CARDIOVASCULAR:  S1 and S2 regular. No murmur, no gallop, no rub.  ABDOMEN:  Mild right upper quadrant tenderness. No rebound. No guarding.  Abdomen is soft. Bowel sounds present. No organomegaly.  EXTREMITIES:  No pedal edema. No calf tenderness. Does have dorsalis pedis  pulses bilaterally.  NEUROLOGICAL:  No focal neurological deficits.  SKIN:  Multiple bruises on both sides and upper limbs.   LABORATORY DATA:  Initial laboratory data shows urinalysis insignificant  sodium 140, potassium 3.3, chloride at 105, CO2 25, glucose 91, BUN 10,  creatinine 0.9, calcium 9.5. White cell count 4.2, hemoglobin 10.6,  hematocrit 30.6, MCV 95.6, RDW 12.1, platelets 10,000. Differentials show  neutrophils 33%, lymphocytes 64%, atypical lymphocytes. No schizocytes or  blast seen on blood smear. Total bilirubin 0.7, direct 0.1, indirect 0.6.  Alkaline phosphate 90, AST 20, ALT 17. Total protein 6.9, albumin 4.1.  Lipase 34.   Repeat CT  scan of the abdomen and pelvis pending.   ASSESSMENT/PLAN:  [name] is a pleasant 65 year old Caucasian female with a  history of chronic anemia, irritable bowel syndrome presenting with profound  thrombocytopenia, mild anemia, and right upper quadrant pain with diarrhea.   PROBLEM LIST:  1.  Profound thrombocytopenia:  Blood smear not suggestive of      microangiopathy. The patient has mild anemia in addition to atypical      lymphocytes. Leukemia is in consideration. We will obtain hematologic      consult.  2.  Chronic anemia.  3.  Abdominal pain, right upper quadrant with diarrhea:  We will obtain a CT      of the abdomen and pelvis to rule out colitis. Check stool for C.      difficile culture and fecal leukocyte. This pain may as well be related      to irritable bowel syndrome. If CT of the abdomen is negative, would      consider starting Bentyl.  4.  Elevated blood pressure probably secondary to pain:  We will monitor.  5.  Palpitations:  Admit to telemetry. Check TSH. This may be related to      anxiety. We will continue Xanax p.r.n.  6.  Hypokalemia: We will repleat with 40 mEq of KCl.       MBB/MEDQ  D:  09/06/2004  T:  09/06/2004  Job:  [id number]   cc:   Talmadge Coventry, M.D.  [address]  [place]  [place] [address]  Fax: [phone]

## 2010-09-05 IMAGING — CR DG CHEST 2V
2 series · 2 of 2 positions shown · non-contrast
Comparison: None

CLINICAL DATA: Preop for cardiac catheterization, chest pain, some
shortness of breath

CHEST - 2 VIEW

[view not recorded (1 of 2)]
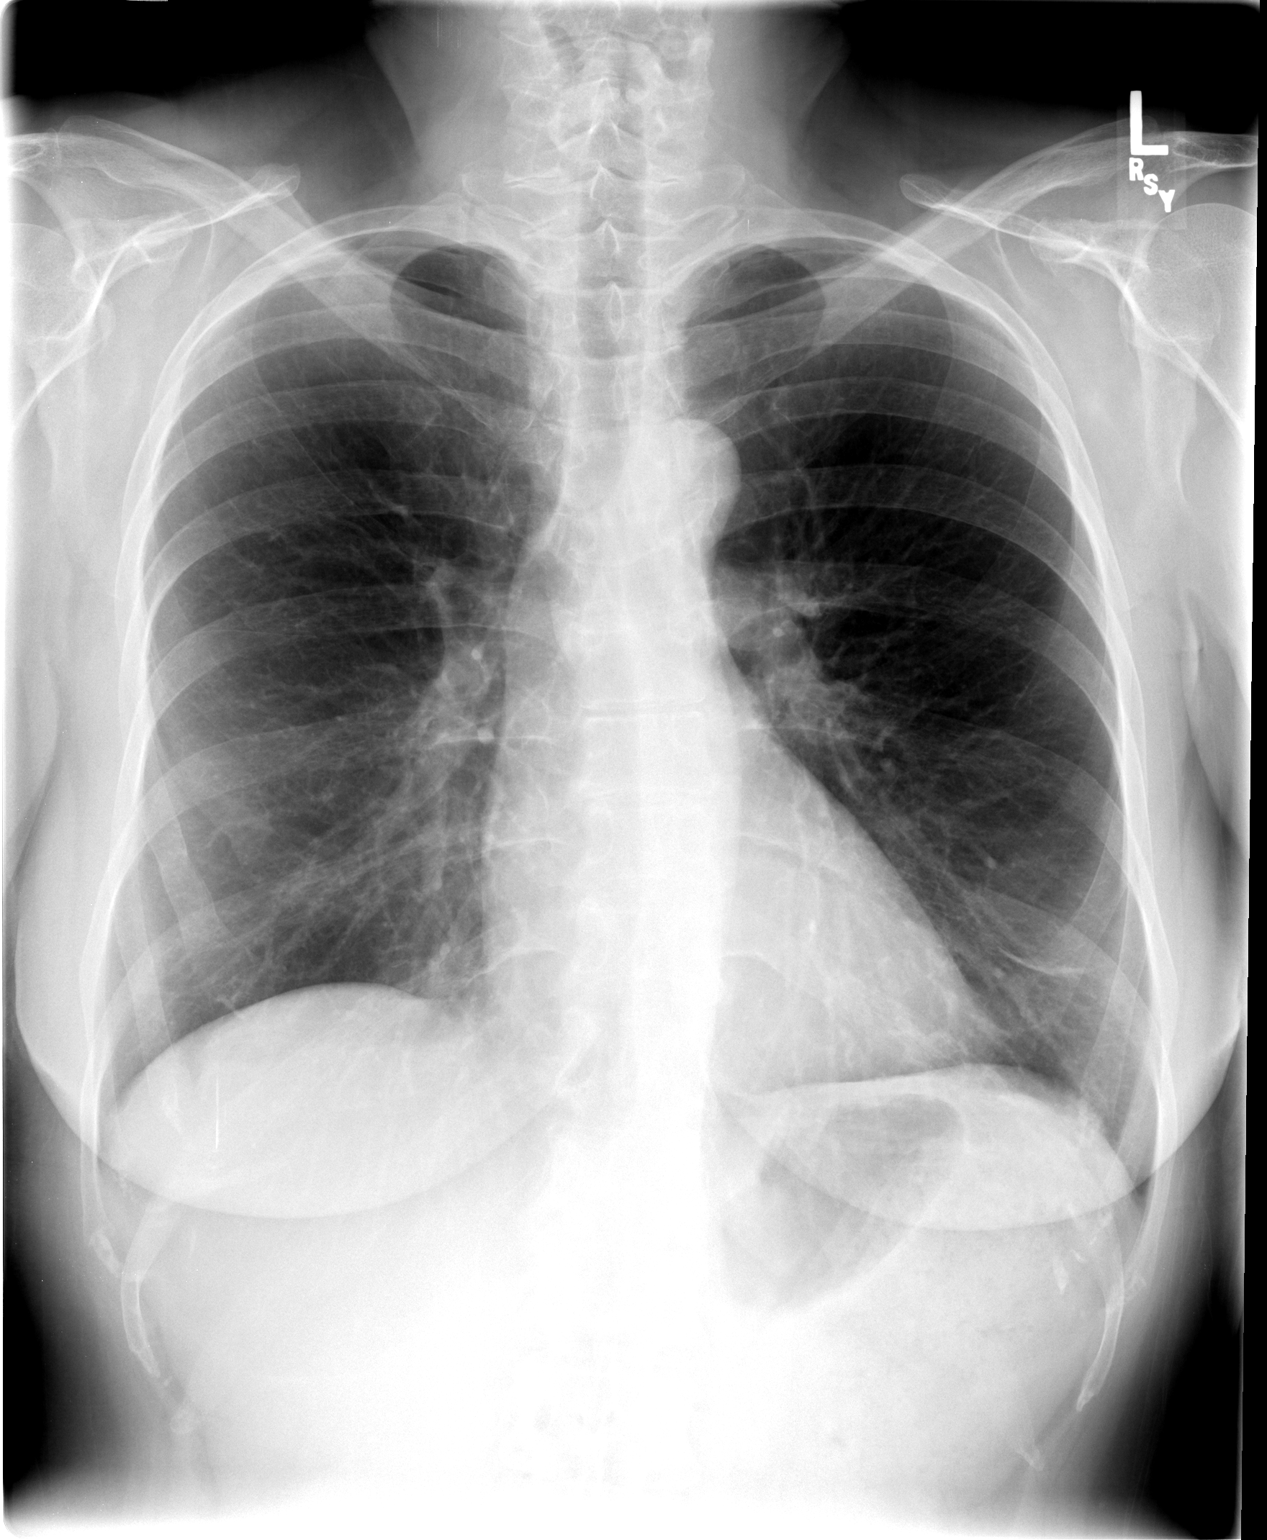

[view not recorded (2 of 2)]
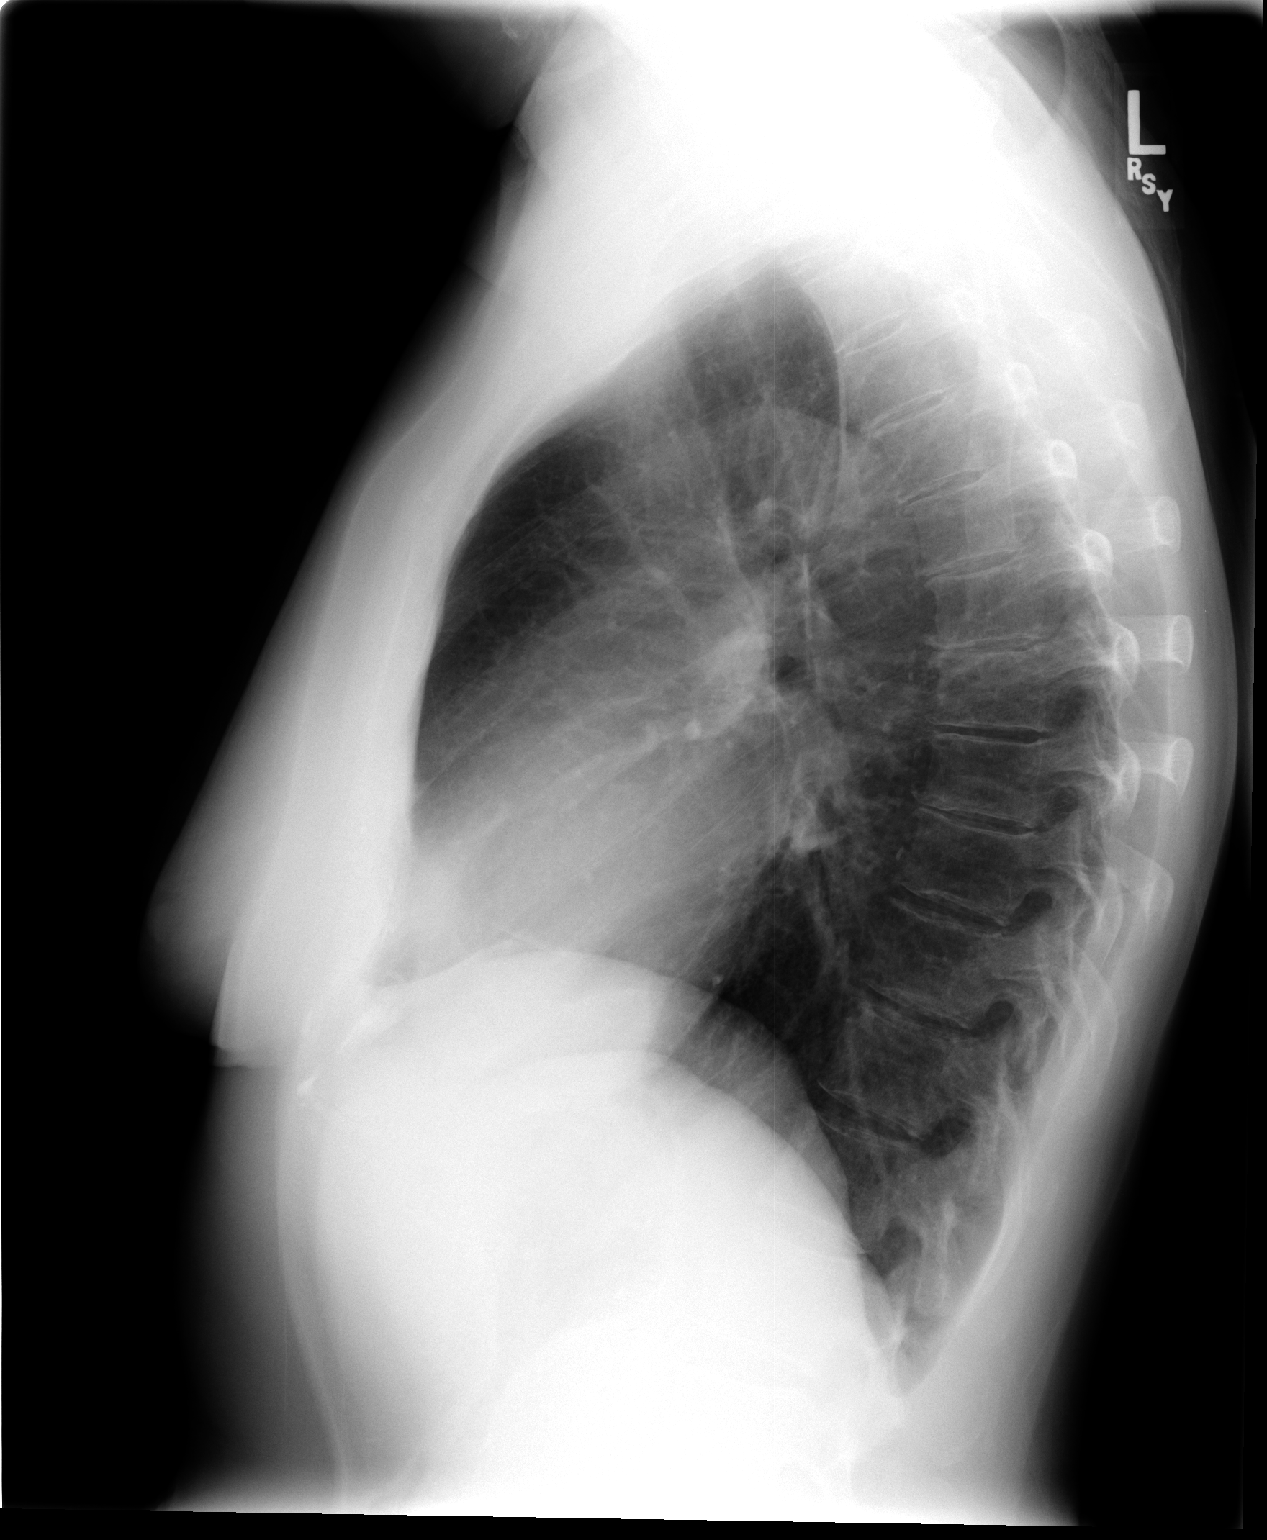

[2 of 2 positions shown; findings below may reference images not displayed]

FINDINGS: The lungs are clear.  The heart is within normal limits
in size.  Probable linear scarring is noted at the left lung base.
No bony abnormality is seen.
IMPRESSION: No active lung disease.

## 2011-12-28 IMAGING — RF DG HIP OPERATIVE*R*
1 series · 2 of 2 positions shown · non-contrast
Comparison: 01/04/2009

CLINICAL DATA: Right hip arthroscopy.

OPERATIVE RIGHT HIP

[Series 1: run · 2 of 2 slices shown]
[im 1/2]
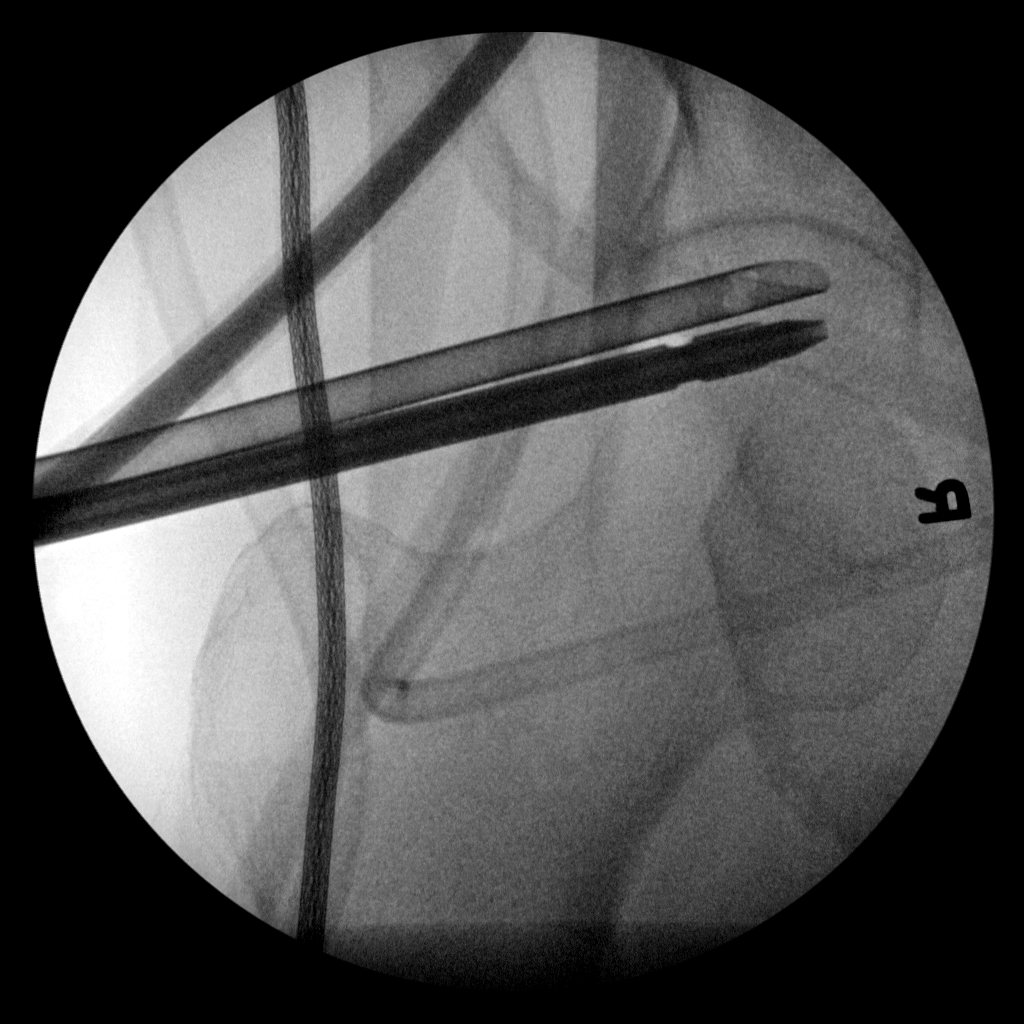
[im 2/2]
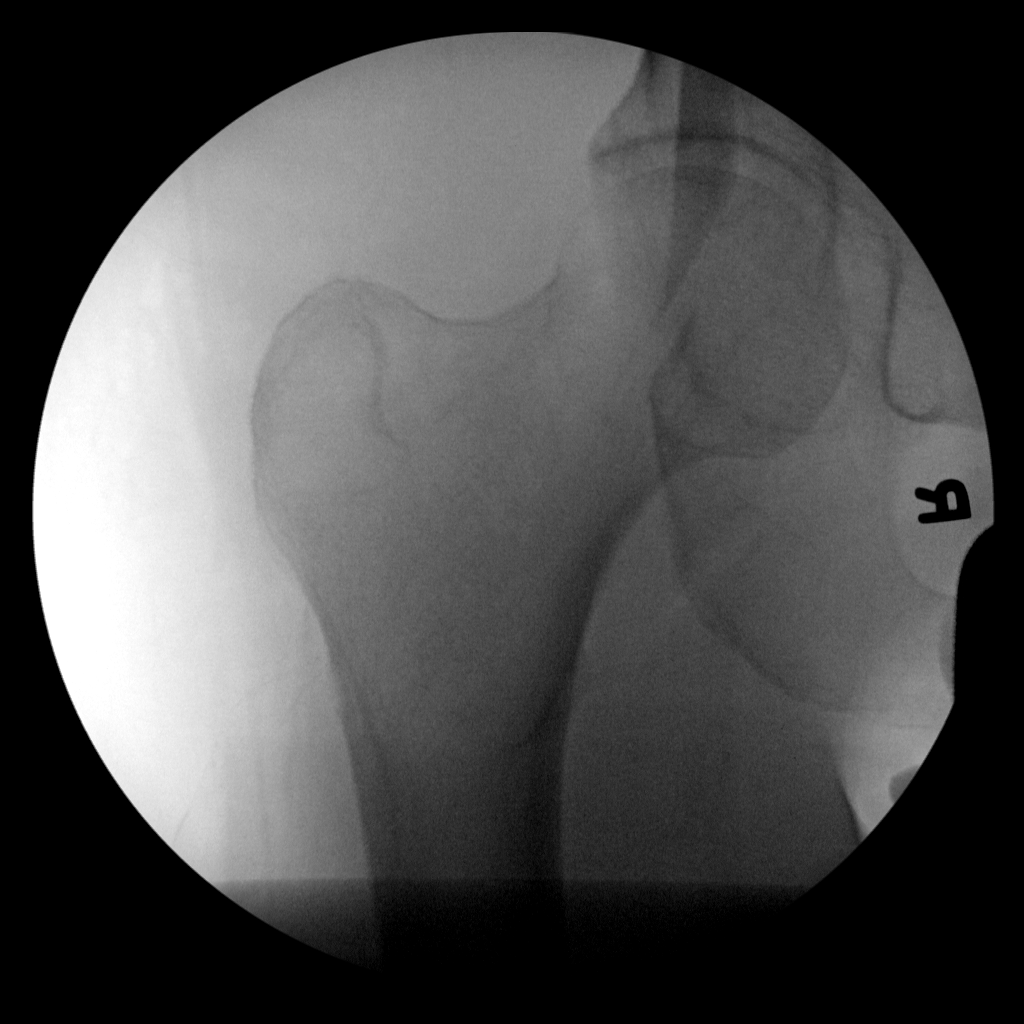

[2 of 2 positions shown; findings below may reference images not displayed]

FINDINGS: 2 intraoperative views.  The first image demonstrates
surgical devices projecting over the superior lateral aspect of the
joint space.  The second image demonstrates normal appearance of
the right hip.
IMPRESSION: Intraoperative imaging.

## 2012-01-25 ENCOUNTER — Encounter (HOSPITAL_COMMUNITY): Payer: Self-pay | Admitting: Emergency Medicine

## 2012-01-25 ENCOUNTER — Emergency Department (HOSPITAL_COMMUNITY): Payer: Medicare Other

## 2012-01-25 ENCOUNTER — Observation Stay (HOSPITAL_COMMUNITY): Payer: Medicare Other

## 2012-01-25 ENCOUNTER — Observation Stay (HOSPITAL_COMMUNITY)
Admission: EM | Admit: 2012-01-25 | Discharge: 2012-01-25 | Disposition: A | Discharge status: Home / Self Care | Payer: Medicare Other | Attending: Emergency Medicine | Admitting: Emergency Medicine

## 2012-01-25 DIAGNOSIS — R079 Chest pain, unspecified: Principal | ICD-10-CM | POA: Insufficient documentation

## 2012-01-25 DIAGNOSIS — R110 Nausea: Secondary | ICD-10-CM | POA: Insufficient documentation

## 2012-01-25 DIAGNOSIS — R0602 Shortness of breath: Secondary | ICD-10-CM | POA: Insufficient documentation

## 2012-01-25 HISTORY — DX: Personal history of other endocrine, nutritional and metabolic disease: Z86.39

## 2012-01-25 HISTORY — PX: OTHER SURGICAL HISTORY: SHX169

## 2012-01-25 LAB — URINALYSIS, ROUTINE W REFLEX MICROSCOPIC
Leukocytes, UA: NEGATIVE
Nitrite: NEGATIVE
Protein, ur: NEGATIVE mg/dL
Urobilinogen, UA: 0.2 mg/dL (ref 0.0–1.0)

## 2012-01-25 LAB — CBC WITH DIFFERENTIAL/PLATELET
Basophils Absolute: 0 10*3/uL (ref 0.0–0.1)
Eosinophils Absolute: 0 10*3/uL (ref 0.0–0.7)
Eosinophils Relative: 0 % (ref 0–5)
Lymphocytes Relative: 19 % (ref 12–46)
MCH: 31.8 pg (ref 26.0–34.0)
MCV: 92.5 fL (ref 78.0–100.0)
Neutrophils Relative %: 74 % (ref 43–77)
Platelets: 207 10*3/uL (ref 150–400)
RDW: 12.6 % (ref 11.5–15.5)
WBC: 8.4 10*3/uL (ref 4.0–10.5)

## 2012-01-25 LAB — BASIC METABOLIC PANEL
Calcium: 9.5 mg/dL (ref 8.4–10.5)
GFR calc non Af Amer: 72 mL/min — ABNORMAL LOW (ref >90)
Potassium: 3.8 mEq/L (ref 3.5–5.1)
Sodium: 139 mEq/L (ref 135–145)

## 2012-01-25 LAB — TROPONIN I: Troponin I: <0.3 ng/mL (ref <0.30)

## 2012-01-25 MED ORDER — IOHEXOL 350 MG/ML SOLN
80.0000 mL | Freq: Once | INTRAVENOUS | Status: AC | PRN
  Administered 2012-01-25: 80 mL via INTRAVENOUS

## 2012-01-25 MED ORDER — NITROGLYCERIN 0.4 MG SL SUBL
SUBLINGUAL_TABLET | SUBLINGUAL | Status: AC
  Administered 2012-01-25: 0.4 mg via SUBLINGUAL
  Filled 2012-01-25: qty 25

## 2012-01-25 MED ORDER — CYCLOBENZAPRINE HCL 10 MG PO TABS: 10.0000 mg | ORAL_TABLET | Freq: Two times a day (BID) | ORAL | Status: DC | PRN

## 2012-01-25 MED ORDER — NITROGLYCERIN 0.4 MG SL SUBL
0.4000 mg | SUBLINGUAL_TABLET | Freq: Once | SUBLINGUAL | Status: AC
  Administered 2012-01-25: 0.4 mg via SUBLINGUAL

## 2012-01-25 MED ORDER — METOPROLOL TARTRATE 1 MG/ML IV SOLN
INTRAVENOUS | Status: AC
  Administered 2012-01-25: 5 mg via INTRAVENOUS
  Filled 2012-01-25: qty 20

## 2012-01-25 MED ORDER — METOPROLOL TARTRATE 1 MG/ML IV SOLN
5.0000 mg | Freq: Once | INTRAVENOUS | Status: AC
  Administered 2012-01-25: 5 mg via INTRAVENOUS

## 2012-01-25 MED ORDER — METOPROLOL TARTRATE 25 MG PO TABS
100.0000 mg | ORAL_TABLET | Freq: Once | ORAL | Status: AC
  Administered 2012-01-25: 100 mg via ORAL
  Filled 2012-01-25: qty 4

## 2012-01-25 MED ORDER — FENTANYL CITRATE 0.05 MG/ML IJ SOLN
50.0000 ug | Freq: Once | INTRAMUSCULAR | Status: AC
  Administered 2012-01-25: 50 ug via INTRAVENOUS
  Filled 2012-01-25: qty 2

## 2012-01-25 MED ORDER — OXYCODONE-ACETAMINOPHEN 5-325 MG PO TABS: 2.0000 | ORAL_TABLET | Freq: Four times a day (QID) | ORAL | Status: DC | PRN

## 2012-01-25 NOTE — ED Provider Notes (Signed)
3:45pm Patient care assumed from [place], [place]. Patient in CDU on CP protocol. Reassesed CP free. Heart: Normal rate and rhythmn. Lungs CTA. Abdomen non-tender with normal bowel sounds. Will provide symptomatic relief as needed.   Cardiac CT results phone in by Dr. Reche Dixon. He recommends outpatient follow-up and discharge. Discharged with cardiology referral and return precautions.    CT Heart Morp W/Cta Cor W/Score W/Ca W/Cm &/Or Wo/Cm (Final result)   Result time:01/25/12 1711    Final result by Rad Results In Interface (01/25/12 17:11:47)    Narrative:   *RADIOLOGY REPORT*  INDICATION: Left arm numbness and diaphoresis. Shortness of breath since Friday.  CT ANGIOGRAPHY OF THE HEART, CORONARY ARTERY, STRUCTURE, AND MORPHOLOGY  CONTRAST: 80mL OMNIPAQUE IOHEXOL 350 MG/ML SOLN  COMPARISON: Plain film chest of earlier today. No prior CT.  TECHNIQUE: CT angiography of the coronary vessels was performed on a 256 channel system using prospective ECG gating. A scout and noncontrast exam (for calcium scoring) were performed. Circulation time was measured using a test bolus. Coronary CTA was performed with sub mm slice collimation during portions of the cardiac cycle after prior injection of iodinated contrast. Imaging post processing was performed on an independent workstation creating multiplanar and 3-D images, and quantitative analysis of the heart and coronary arteries. Note that this exam targets the heart and the chest was not imaged in its entirety.  PREMEDICATION: Lopressor 100 mg, P.O. Lopressor 5 mg, IV Nitroglycerin 0.4 mcg, sublingual.  FINDINGS: Technical quality: Good  Heart rate: 65  CORONARY ARTERIES: Left main coronary artery: Arises from the left coronary cusp and is a relatively short vessel. Suspicion of mild noncalcified plaque along its superior most portion, causing less than 25% stenosis. Left anterior descending: Calcified and noncalcified plaque in  its proximal portion. This causes causes minimal, less than 25% narrowing. Then gives rise to a large multi branching first diagonal which is normal. Continues as a relatively short diminutive vessel. Mild misregistration in its mid to distal portion without gross abnormality. Left circumflex: Immediately gives rise to two patent diminutive marginal branches. A third small marginal in its midportion and distal fourth marginal which is normal. Continues as a diminutive AV groove branch. Right coronary artery: Large, dominant vessel which arises from the right coronary cusp. Without significant disease. Gives rise to a small patent acute marginal branch. Mild misregistration in its proximal portion. Posterior descending artery: Large, normal. Dominance: Right-sided  CORONARY CALCIUM:  Total Agatston Score: 17.5 (all positioned within the proximal LAD). MESA database percentile: 61st  CARDIAC: No left atrial appendage thrombus. No septal defect. No cardiac mass.  AORTA AND PULMONARY MEASUREMENTS: Aortic root (21 - 40 mm): 23 at the annulus 29 at the sinuses of Valsalva 20 at the sinotubular junction Ascending aorta ( < 40 mm): 29 Descending aorta ( < 40 mm): 23 Main pulmonary artery: ( < 30 mm): 23  EXTRACARDIAC FINDINGS: Lung windows demonstrate volume loss in the anterior medial right middle lobe. Bibasilar scarring or atelectasis.  Soft tissue windows demonstrate no imaged thoracic adenopathy. No aortic dissection. No pericardial or pleural effusion. No central pulmonary embolism, on this non-dedicated study.  Limited abdominal imaging demonstrates no significant findings. Lower thoracic hemangiomas  IMPRESSION:  1. Mildly age advanced but nonobstructive coronary artery disease. The patient's total coronary artery calcium score is 17.5, which is 61st percentile for patient's matched age and gender. 2. Calcified and noncalcified proximal LAD plaque  without significant stenosis. Suspicion of noncalcified plaque at the left main  origin superiorly, with minimal stenosis suspected. 3. Right-sided coronary artery dominance. 4. No pertinent extracardiac finding.   Original Report Authenticated By: Jeronimo Greaves, M.D.      Pixie Casino, PA-C 01/25/12 1740

## 2012-01-25 NOTE — ED Provider Notes (Signed)
History     CSN: [id number]  Arrival date & time 01/25/12  1032   First MD Initiated Contact with Patient 01/25/12 1035      Chief Complaint  Patient presents with  . Chest Pain    (Consider location/radiation/quality/duration/timing/severity/associated sxs/prior treatment) HPI  66 year old female presents complaining of chest pain. Patient reports she has had pain to her right adnexal right lower abdomen for the past 10-15 years. Pain is constant, happens daily, described as a sharp and throbbing sensation and possibly related to having a bad childbirth. However for the past several days she has been having left-sided chest pressure which correspond with the chronic low abdominal pain. The chest pressure sensation is associated with some shortness of breath, the pain radiates to her left arm, and she also endorsed nausea. Her chest pain is waxing waning corresponding to her low abdominal pain. She is currently pain-free after receiving aspirin and nitroglycerin given by EMS. Patient reports her last cardiac stress test was 3 years ago, and was normal. She has history of hypertension, and takes bistolic. No history of premature cardiac death, no history of diabetes, and patient is a nonsmoker. Patient continues to endorse low abdominal pain, this is not new.    No past medical history on file.  No past surgical history on file.  No family history on file.  History  Substance Use Topics  . Smoking status: Not on file  . Smokeless tobacco: Not on file  . Alcohol Use: Not on file    OB History    No data available      Review of Systems  All other systems reviewed and are negative.    Allergies  Valium  Home Medications  No current outpatient prescriptions on file.  BP 145/79  Pulse 102  Temp 98.8 F (37.1 C) (Oral)  Resp 20  Ht 5\' 2"  (1.575 m)  Wt 150 lb (68.04 kg)  BMI 27.44 kg/m2  SpO2 100%  Physical Exam  Nursing note and vitals reviewed. Constitutional:  She is oriented to person, place, and time. She appears well-developed and well-nourished. No distress.       Awake, alert, nontoxic appearance  HENT:  Head: Atraumatic.  Eyes: Conjunctivae normal are normal. Right eye exhibits no discharge. Left eye exhibits no discharge.  Neck: Neck supple.  Cardiovascular: Normal rate and regular rhythm.  Exam reveals no gallop and no friction rub.   No murmur heard. Pulmonary/Chest: Effort normal. No respiratory distress. She exhibits no tenderness.  Abdominal: Soft. She exhibits no distension. There is tenderness (RLQ tenderness on palpation, no hernia or overlying skin changes noted.  ). There is no rebound.  Musculoskeletal: She exhibits no edema and no tenderness.       ROM appears intact, no obvious focal weakness  Neurological: She is alert and oriented to person, place, and time.       Mental status and motor strength appears intact  Skin: No rash noted.  Psychiatric: She has a normal mood and affect.    ED Course  Procedures (including critical care time)  Labs Reviewed - No data to display No results found.   No diagnosis found.   Date: 01/25/2012  Rate: 105  Rhythm: sinus tachycardia  QRS Axis: normal  Intervals: normal  ST/T Wave abnormalities: normal  Conduction Disutrbances:none  Narrative Interpretation:   Old EKG Reviewed: unchanged  Results for orders placed during the hospital encounter of 01/25/12  CBC WITH DIFFERENTIAL      Component  Value Range   WBC 8.4  4.0 - 10.5 K/uL   RBC 3.62 (*) 3.87 - 5.11 MIL/uL   Hemoglobin 11.5 (*) 12.0 - 15.0 g/dL   HCT 40.9 (*) 81.1 - 91.4 %   MCV 92.5  78.0 - 100.0 fL   MCH 31.8  26.0 - 34.0 pg   MCHC 34.3  30.0 - 36.0 g/dL   RDW 78.2  95.6 - 21.3 %   Platelets 207  150 - 400 K/uL   Neutrophils Relative 74  43 - 77 %   Neutro Abs 6.2  1.7 - 7.7 K/uL   Lymphocytes Relative 19  12 - 46 %   Lymphs Abs 1.6  0.7 - 4.0 K/uL   Monocytes Relative 6  3 - 12 %   Monocytes Absolute 0.5   0.1 - 1.0 K/uL   Eosinophils Relative 0  0 - 5 %   Eosinophils Absolute 0.0  0.0 - 0.7 K/uL   Basophils Relative 0  0 - 1 %   Basophils Absolute 0.0  0.0 - 0.1 K/uL  BASIC METABOLIC PANEL      Component Value Range   Sodium 139  135 - 145 mEq/L   Potassium 3.8  3.5 - 5.1 mEq/L   Chloride 105  96 - 112 mEq/L   CO2 21  19 - 32 mEq/L   Glucose, Bld 102 (*) 70 - 99 mg/dL   BUN 8  6 - 23 mg/dL   Creatinine, Ser 0.86  0.50 - 1.10 mg/dL   Calcium 9.5  8.4 - 57.8 mg/dL   GFR calc non Af Amer 72 (*) >90 mL/min   GFR calc Af Amer 83 (*) >90 mL/min  URINALYSIS, ROUTINE W REFLEX MICROSCOPIC      Component Value Range   Color, Urine YELLOW  YELLOW   APPearance CLEAR  CLEAR   Specific Gravity, Urine 1.008  1.005 - 1.030   pH 6.0  5.0 - 8.0   Glucose, UA NEGATIVE  NEGATIVE mg/dL   Hgb urine dipstick NEGATIVE  NEGATIVE   Bilirubin Urine NEGATIVE  NEGATIVE   Ketones, ur NEGATIVE  NEGATIVE mg/dL   Protein, ur NEGATIVE  NEGATIVE mg/dL   Urobilinogen, UA 0.2  0.0 - 1.0 mg/dL   Nitrite NEGATIVE  NEGATIVE   Leukocytes, UA NEGATIVE  NEGATIVE  TROPONIN I      Component Value Range   Troponin I <0.30  <0.30 ng/mL   Dg Chest 2 View  01/25/2012  *RADIOLOGY REPORT*  Clinical Data: Chest pain  CHEST - 2 VIEW  Comparison: 04/29/2010  Findings: Normal heart size and vascularity.  Scarring in the lingula noted.  No focal pneumonia, collapse, consolidation, effusion, or pneumothorax.  Trachea midline.  Degenerative changes of the spine.  IMPRESSION: Stable exam.  No superimposed acute process.   Original Report Authenticated By: Judie Petit. Shick, M.D.     1. Chest pain 2. Lower abdominal pain   MDM  Pt presents with chronic abd pain for the past 15 years, no new changes.  She also endorse intermittent L sided chest pain/pressure with sob, and nausea.  Pain is atypical, pt is of low cardiac risk, she is currently CP free.  Plan to perform cardiac rule out in CDU.  Doubt PE, no significant risk.   Care discussed  with my attending, who agrees.     2:47 PM Care discussed with CDU PA, who will continue with CPP with coronary CT.         Greta Doom  Laveda Norman, PA-C 01/25/12 1448

## 2012-01-25 NOTE — ED Notes (Signed)
Per EMS: Pt c/o cp since Friday, sob, left arm numb, diaphoresis, right leg numbness. Pt also c/o femoral throbbing pain r/t possible hx of hysterectomy after bad child birth and d/c w/ possible nerve damage. EMS administered 324 ASA, Nitro x1. Pt came from pain center. 20 G right wrist. ST 12-lead normal, 98% on 2L N/C.

## 2012-01-25 NOTE — ED Notes (Signed)
27.7 BMI

## 2012-01-25 NOTE — ED Provider Notes (Signed)
Medical screening examination/treatment/procedure(s) were conducted as a shared visit with non-physician practitioner(s) and myself.  I personally evaluated the patient during the encounter.  Pt comes in with chest pain. Low risk patient, appropriate for CDU chest pain protocol.    Derwood Kaplan, MD 01/25/12 1544

## 2012-01-25 NOTE — ED Notes (Signed)
PT HAS RETURNED FROM CT. TOLERATED WELL.

## 2012-01-26 NOTE — ED Provider Notes (Signed)
Medical screening examination/treatment/procedure(s) were performed by non-physician practitioner and as supervising physician I was immediately available for consultation/collaboration.  Sieara Bremer T Trachelle Low, MD 01/26/12 2239 

## 2012-06-06 ENCOUNTER — Other Ambulatory Visit: Payer: Self-pay | Admitting: Neurology

## 2015-06-26 DIAGNOSIS — R079 Chest pain, unspecified: Secondary | ICD-10-CM

## 2015-06-26 HISTORY — DX: Chest pain, unspecified: R07.9

## 2015-06-27 ENCOUNTER — Encounter: Payer: Self-pay | Admitting: Cardiovascular Disease

## 2015-06-27 ENCOUNTER — Ambulatory Visit (INDEPENDENT_AMBULATORY_CARE_PROVIDER_SITE_OTHER): Payer: Medicare Other | Admitting: Cardiovascular Disease

## 2015-06-27 VITALS — BP 124/70 | HR 68 | Ht 62.0 in | Wt 149.0 lb

## 2015-06-27 DIAGNOSIS — R002 Palpitations: Secondary | ICD-10-CM | POA: Insufficient documentation

## 2015-06-27 DIAGNOSIS — R079 Chest pain, unspecified: Secondary | ICD-10-CM | POA: Diagnosis not present

## 2015-06-27 DIAGNOSIS — I7789 Other specified disorders of arteries and arterioles: Secondary | ICD-10-CM | POA: Diagnosis not present

## 2015-06-27 DIAGNOSIS — E785 Hyperlipidemia, unspecified: Secondary | ICD-10-CM | POA: Insufficient documentation

## 2015-06-27 DIAGNOSIS — R011 Cardiac murmur, unspecified: Secondary | ICD-10-CM | POA: Diagnosis not present

## 2015-06-27 DIAGNOSIS — I10 Essential (primary) hypertension: Secondary | ICD-10-CM

## 2015-06-27 DIAGNOSIS — I773 Arterial fibromuscular dysplasia: Secondary | ICD-10-CM | POA: Insufficient documentation

## 2015-06-27 LAB — COMPREHENSIVE METABOLIC PANEL
ALBUMIN: 4.4 g/dL (ref 3.6–5.1)
ALK PHOS: 97 U/L (ref 33–130)
ALT: 13 U/L (ref 6–29)
AST: 15 U/L (ref 10–35)
BILIRUBIN TOTAL: 0.5 mg/dL (ref 0.2–1.2)
BUN: 24 mg/dL (ref 7–25)
CALCIUM: 9.6 mg/dL (ref 8.6–10.4)
CO2: 22 mmol/L (ref 20–31)
Chloride: 106 mmol/L (ref 98–110)
Creat: 1.21 mg/dL — ABNORMAL HIGH (ref 0.50–0.99)
Glucose, Bld: 83 mg/dL (ref 65–99)
Potassium: 4.8 mmol/L (ref 3.5–5.3)
Sodium: 138 mmol/L (ref 135–146)
TOTAL PROTEIN: 7.4 g/dL (ref 6.1–8.1)

## 2015-06-27 LAB — LIPID PANEL
CHOLESTEROL: 190 mg/dL (ref 125–200)
HDL: 52 mg/dL (ref >=46)
LDL Cholesterol: 108 mg/dL (ref <130)
TRIGLYCERIDES: 148 mg/dL (ref <150)
Total CHOL/HDL Ratio: 3.7 Ratio (ref <=5.0)
VLDL: 30 mg/dL (ref <30)

## 2015-06-27 LAB — TSH: TSH: 1.18 mIU/L

## 2015-06-27 LAB — CBC
HEMATOCRIT: 32 % — AB (ref 35.0–45.0)
HEMOGLOBIN: 10.7 g/dL — AB (ref 11.7–15.5)
MCH: 32.1 pg (ref 27.0–33.0)
MCHC: 33.4 g/dL (ref 32.0–36.0)
MCV: 96.1 fL (ref 80.0–100.0)
MPV: 11.9 fL (ref 7.5–12.5)
Platelets: 203 10*3/uL (ref 140–400)
RBC: 3.33 MIL/uL — AB (ref 3.80–5.10)
RDW: 13.5 % (ref 11.0–15.0)
WBC: 6.4 10*3/uL (ref 3.8–10.8)

## 2015-06-27 NOTE — Progress Notes (Signed)
Patient ID: [name], female   DOB: 01/13/1946, 70 y.o.   MRN: [id number]     Primary MD: Dr. Malvin Johns  PATIENT PROFILE: [name] is a 70 y.o. female who is a former patient of Dr. Rex Kras, and last saw him in 2012.  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  She presents to the office today to establish cardiology care with me.   HPI:  [name] has a history of hypertension in the past had episodes of chest pressure.  In October 2010.  She underwent cardiac catheterization by Dr. Rex Kras which showed normal coronary arteries.  She had hyperdynamic LV function.  There was a questionable region of fibromuscular dysplasia of her left renal artery.  Subsequent Doppler studies have shown less than 60% narrowing of her renal arteries and suggested possible fibromuscular dysplasia.  She denies any recent history of exertionally precipitated chest pain.  She has a history of palpitations.  She has noticed some short fleeting episodes of nonexertional chest discomfort.  Typically palpitations occur at least once per week.  She denies associated presyncope or syncope.  She presents to establish cardiology care.  Past Medical History  Diagnosis Date  . H/O non anemic vitamin B12 deficiency   . Chest pain 06/26/2015    Past Surgical History  Procedure Laterality Date  . Abdominal hysterectomy    . Appendectomy    . Cholecystectomy    . Other surgical history  01/25/2012    ct angiogram   . Other surgical history  04/07/2007    echocardiography  . Cardiac catheterization  01/16/2009  . Doppler echocardiography      08/07/2009, 02/06/2009, 05/04/2007, 05/03/2007    Allergies  Allergen Reactions  . Omnicef [Cefdinir] Anaphylaxis, Swelling and Rash  . Penicillins Diarrhea  . Valium Other (See Comments)    hypotension  . Gabapentin Palpitations  . Hydromorphone Palpitations    Current Outpatient Prescriptions  Medication Sig  Dispense Refill  . ALPRAZolam (XANAX) 0.5 MG tablet Take 0.5 mg by mouth 3 (three) times daily as needed. For anxiety    . atorvastatin (LIPITOR) 10 MG tablet Take 10 mg by mouth.    Marland Kitchen azelastine (OPTIVAR) 0.05 % ophthalmic solution     . carvedilol (COREG) 6.25 MG tablet 6.25 mg 2 (two) times daily.    . DULoxetine (CYMBALTA) 30 MG capsule Take 30 mg by mouth.    . losartan-hydrochlorothiazide (HYZAAR) 50-12.5 MG tablet     . [START ON 07/06/2015] oxyCODONE-acetaminophen (PERCOCET/ROXICET) 5-325 MG tablet Take 5-325 tablets by mouth every 6 (six) hours.     No current facility-administered medications for this visit.    Social History   Social History  . Marital Status: Single    Spouse Name: N/A  . Number of Children: N/A  . Years of Education: N/A   Occupational History  . Not on file.   Social History Main Topics  . Smoking status: Never Smoker   . Smokeless tobacco: Not on file  . Alcohol Use: No  . Drug Use: No  . Sexual Activity: Not on file   Other Topics Concern  . Not on file   Social History Narrative   Additional social history is notable in that she is divorced but lives with her ex-husband.  He has 2 children and 1 grandchild.  There is no history of tobacco use.  She does not drink alcohol.  She does not exercise regularly  but walks with her daughter twice per week  Family History  Problem Relation Age of Onset  . Angina Mother    Family history is notable that both parents are deceased.  Her mother died at age 69 with colon cancer in her father died at age 78 with lung cancer.  She has a living brother rate 68.  One brother committed suicide.  Another brother died with lung cancer.  Another brother had Alzheimer's.  ROS General: Negative; No fevers, chills, or night sweats HEENT: Negative; No changes in vision or hearing, sinus congestion, difficulty swallowing Pulmonary: Negative; No cough, wheezing, shortness of breath, hemoptysis Cardiovascular:  See  HPI; GI: Negative; No nausea, vomiting, diarrhea, or abdominal pain GU: Negative; No dysuria, hematuria, or difficulty voiding Musculoskeletal: Negative; no myalgias, joint pain, or weakness Hematologic/Oncologic: Negative; no easy bruising, bleeding Endocrine: Negative; no heat/cold intolerance; no diabetes Neuro: Negative; no changes in balance, headaches Skin: Negative; No rashes or skin lesions Psychiatric: Negative; No behavioral problems, depression Sleep: Negative; No daytime sleepiness, hypersomnolence, bruxism, restless legs, hypnogagnic hallucinations Other comprehensive 14 point system review is negative   Physical Exam BP 124/70 mmHg  Pulse 68  Ht _0  (1.575 m)  Wt 149 lb (67.586 kg)  BMI 27.25 kg/m2  Wt Readings from Last 3 Encounters:  06/27/15 149 lb (67.586 kg)  01/25/12 151 lb 7 oz (68.692 kg)   General: Alert, oriented, no distress.  Skin: normal turgor, no rashes, warm and dry HEENT: Normocephalic, atraumatic. Pupils equal round and reactive to light; sclera anicteric; extraocular muscles intact; Fundi Disks flat.  No AV nicking, hemorrhages or exudates. Nose without nasal septal hypertrophy Mouth/Parynx benign; Mallinpatti scale 2 Neck: No JVD, no carotid bruits; normal carotid upstroke Lungs: clear to ausculatation and percussion; no wheezing or rales Chest wall: without tenderness to palpitation Heart: PMI not displaced, RRR, s1 s2 normal, 1/6 systolic murmur in the aortic area, no diastolic murmur, no rubs, gallops, thrills, or heaves Abdomen: soft, nontender; no hepatosplenomehaly, BS+; abdominal aorta nontender and not dilated by palpation. Back: no CVA tenderness Pulses 2+ Musculoskeletal: full range of motion, normal strength, no joint deformities Extremities: no clubbing cyanosis or edema, Homan's sign negative  Neurologic: grossly nonfocal; Cranial nerves grossly wnl Psychologic: Normal mood and affect   ECG (independently read by me): Normal  sinus rhythm at 68 bpm.  Normal intervals.  No significant ST segment changes.  LABS:  BMP Latest Ref Rng 01/25/2012 04/29/2010  Glucose 70 - 99 mg/dL 102(H) 100(H)  BUN 6 - 23 mg/dL 8 9  Creatinine 0.50 - 1.10 mg/dL 0.83 0.92  Sodium 135 - 145 mEq/L 139 140  Potassium 3.5 - 5.1 mEq/L 3.8 4.2  Chloride 96 - 112 mEq/L 105 107  CO2 19 - 32 mEq/L 21 26  Calcium 8.4 - 10.5 mg/dL 9.5 9.4     Hepatic Function Latest Ref Rng 04/29/2010  Total Protein 6.0 - 8.3 g/dL 7.0  Albumin 3.5 - 5.2 g/dL 4.2  AST 0 - 37 U/L 16  ALT 0 - 35 U/L 11  Alk Phosphatase 39 - 117 U/L 97  Total Bilirubin 0.3 - 1.2 mg/dL 0.3    CBC Latest Ref Rng 01/25/2012 04/29/2010  WBC 4.0 - 10.5 K/uL 8.4 6.4  Hemoglobin 12.0 - 15.0 g/dL 11.5(L) 11.0(L)  Hematocrit 36.0 - 46.0 % 33.5(L) 31.9(L)  Platelets 150 - 400 K/uL 207 199   Lab Results  Component Value Date   MCV 92.5 01/25/2012   MCV 89.4 04/29/2010  No results found for: TSH No results found for: HGBA1C   BNP No results found for: BNP  ProBNP No results found for: PROBNP   Lipid Panel  No results found for: CHOL, TRIG, HDL, CHOLHDL, VLDL, LDLCALC, LDLDIRECT  RADIOLOGY: No results found.   ASSESSMENT AND PLAN: Ms. Emmi Wertheim is a 70 year old female who has a long-standing history of hypertension and has been treated with losartan HCT 50/12.5 in addition to carvedilol 6.25 mg twice a day.  Strip mild hyperlipidemia and has been on atorvastatin 10 mg.  Remotely, she had undergone cardiac catheterization for somewhat atypical chest pain which revealed normal coronary arteries.  She has experienced occasional episodes of palpitations.  Her resting pulse is 68.  On her current carvedilol dose.  I have suggested slight additional titration to 1-1/2 pills twice a day.  I am scheduling her an echo Doppler study to evaluate systolic and diastolic function as well as her probable aortic sclerosis murmur.  I'm also scheduling her for follow-up renal duplex  imaging to reassess possible fibromuscular dysplasia.  A complete set of laboratory will be obtained in the fasting state.  I will see her in 3 months for cardiology evaluation.   Troy Sine, MD, [facility] 06/27/2015 1:51 PM

## 2015-06-27 NOTE — Patient Instructions (Signed)
Your physician has requested that you have an echocardiogram. Echocardiography is a painless test that uses sound waves to create images of your heart. It provides your doctor with information about the size and shape of your heart and how well your heart's chambers and valves are working. This procedure takes approximately one hour. There are no restrictions for this procedure.   Your physician recommends that you return for lab work.  Your physician has requested that you have a renal artery duplex. During this test, an ultrasound is used to evaluate blood flow to the kidneys. Allow one hour for this exam. Do not eat after midnight the day before and avoid carbonated beverages. Take your medications as you usually do.  Your physician recommends that you schedule a follow-up appointment in: 3 months.

## 2015-07-09 ENCOUNTER — Ambulatory Visit (HOSPITAL_COMMUNITY)
Admission: RE | Admit: 2015-07-09 | Discharge: 2015-07-09 | Disposition: A | Discharge status: Home / Self Care | Payer: Medicare Other | Source: Ambulatory Visit | Attending: Cardiology | Admitting: Cardiology

## 2015-07-09 DIAGNOSIS — I701 Atherosclerosis of renal artery: Secondary | ICD-10-CM | POA: Insufficient documentation

## 2015-07-09 DIAGNOSIS — I7789 Other specified disorders of arteries and arterioles: Secondary | ICD-10-CM | POA: Diagnosis not present

## 2015-07-09 DIAGNOSIS — I773 Arterial fibromuscular dysplasia: Secondary | ICD-10-CM

## 2015-07-15 ENCOUNTER — Encounter: Payer: Self-pay | Admitting: *Deleted

## 2015-07-16 ENCOUNTER — Other Ambulatory Visit: Payer: Self-pay

## 2015-07-16 ENCOUNTER — Ambulatory Visit (HOSPITAL_COMMUNITY): Payer: Medicare Other | Attending: Cardiology

## 2015-07-16 DIAGNOSIS — I071 Rheumatic tricuspid insufficiency: Secondary | ICD-10-CM | POA: Insufficient documentation

## 2015-07-16 DIAGNOSIS — R011 Cardiac murmur, unspecified: Secondary | ICD-10-CM | POA: Diagnosis not present

## 2015-07-16 DIAGNOSIS — I340 Nonrheumatic mitral (valve) insufficiency: Secondary | ICD-10-CM | POA: Diagnosis not present

## 2015-07-16 DIAGNOSIS — I119 Hypertensive heart disease without heart failure: Secondary | ICD-10-CM | POA: Insufficient documentation

## 2015-08-16 ENCOUNTER — Encounter: Payer: Self-pay | Admitting: *Deleted

## 2015-10-14 ENCOUNTER — Ambulatory Visit (INDEPENDENT_AMBULATORY_CARE_PROVIDER_SITE_OTHER): Payer: Medicare Other | Admitting: Cardiovascular Disease

## 2015-10-14 ENCOUNTER — Encounter (INDEPENDENT_AMBULATORY_CARE_PROVIDER_SITE_OTHER): Payer: Self-pay

## 2015-10-14 VITALS — BP 148/74 | HR 62 | Ht 62.0 in | Wt 153.0 lb

## 2015-10-14 DIAGNOSIS — I7789 Other specified disorders of arteries and arterioles: Secondary | ICD-10-CM

## 2015-10-14 DIAGNOSIS — R002 Palpitations: Secondary | ICD-10-CM | POA: Diagnosis not present

## 2015-10-14 DIAGNOSIS — I10 Essential (primary) hypertension: Secondary | ICD-10-CM

## 2015-10-14 DIAGNOSIS — E785 Hyperlipidemia, unspecified: Secondary | ICD-10-CM | POA: Diagnosis not present

## 2015-10-14 DIAGNOSIS — I340 Nonrheumatic mitral (valve) insufficiency: Secondary | ICD-10-CM

## 2015-10-14 DIAGNOSIS — I773 Arterial fibromuscular dysplasia: Secondary | ICD-10-CM

## 2015-10-14 MED ORDER — ATORVASTATIN CALCIUM 20 MG PO TABS: 20.0000 mg | ORAL_TABLET | Freq: Every day | ORAL | 11 refills | Status: AC

## 2015-10-14 NOTE — Patient Instructions (Addendum)
Your physician wants you to follow-up in: 1 year or sooner if needed. You will receive a reminder letter in the mail two months in advance. If you don't receive a letter, please call our office to schedule the follow-up appointment.  The atorvastatin has been increased to 20 mg daily. A new prescription has been sent to your pharmacy.

## 2015-10-15 ENCOUNTER — Encounter: Payer: Self-pay | Admitting: Cardiovascular Disease

## 2015-10-15 DIAGNOSIS — I10 Essential (primary) hypertension: Secondary | ICD-10-CM | POA: Insufficient documentation

## 2015-10-15 DIAGNOSIS — I773 Arterial fibromuscular dysplasia: Secondary | ICD-10-CM | POA: Insufficient documentation

## 2015-10-15 DIAGNOSIS — I340 Nonrheumatic mitral (valve) insufficiency: Secondary | ICD-10-CM | POA: Insufficient documentation

## 2015-10-15 NOTE — Progress Notes (Signed)
Patient ID: [name], female   DOB: 24-Oct-1945, 70 y.o.   MRN: [id number]     Primary MD: Dr. Malvin Johns  PATIENT PROFILE: [name] is a 70 y.o. female who is a former patient of Dr. Rex Kras, and last saw him in 2012.  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  She presents to the office today to establish cardiology care with me.   HPI:  [name] has a history of hypertension in the past had episodes of chest pressure.  In October 2010.  She underwent cardiac catheterization by Dr. Rex Kras which showed normal coronary arteries.  She had hyperdynamic LV function.  There was a questionable region of fibromuscular dysplasia of her left renal artery.  Subsequent Doppler studies have shown less than 60% narrowing of her renal arteries and suggested possible fibromuscular dysplasia.  She denies any recent history of exertionally precipitated chest pain.  She has a history of palpitations.  She has noticed some short fleeting episodes of nonexertional chest discomfort.  Typically palpitations occur at least once per week.  She denies associated presyncope or syncope.   She presented to establish care with me 4 months ago.  When I saw her, I scheduled her for an echo Doppler study which was done on 07/16/2015.  This revealed normal systolic function with grade 1 diastolic dysfunction.  There was mild mitral regurgitation with trivial tricuspid regurgitation.  She had laboratory which revealed anemia.  There was renal insufficiency with a creatinine of 1.21.  Lipid studies revealed cholesterol of 190 with triglycerides 148, HDL 52, and LDL 10 weight.  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  She admits to shortness of breath with lying down but denies any exertional shortness of breath.  She had been on losartan HCT 50/12.5 in addition to carvedilol 6.25 mg twice a day for  hypertension but ran out of her losartan HCT several days ago.  She continues to be on atorvastatin 10 mg for hyperlipidemia.  She presents for reevaluation.  Past Medical History:  Diagnosis Date  . Chest pain 06/26/2015  . H/O non anemic vitamin B12 deficiency     Past Surgical History:  Procedure Laterality Date  . ABDOMINAL HYSTERECTOMY    . APPENDECTOMY    . CARDIAC CATHETERIZATION  01/16/2009  . CHOLECYSTECTOMY    . DOPPLER ECHOCARDIOGRAPHY     08/07/2009, 02/06/2009, 05/04/2007, 05/03/2007  . OTHER SURGICAL HISTORY  01/25/2012   ct angiogram   . OTHER SURGICAL HISTORY  04/07/2007   echocardiography    Allergies  Allergen Reactions  . Omnicef [Cefdinir] Anaphylaxis, Swelling and Rash  . Penicillins Diarrhea  . Valium Other (See Comments)    hypotension  . Gabapentin Palpitations  . Hydromorphone Palpitations    Current Outpatient Prescriptions  Medication Sig Dispense Refill  . ALPRAZolam (XANAX) 0.5 MG tablet Take 0.5 mg by mouth 3 (three) times daily as needed. For anxiety    . azelastine (OPTIVAR) 0.05 % ophthalmic solution     . carvedilol (COREG) 6.25 MG tablet 6.25 mg 2 (two) times daily.    . DULoxetine (CYMBALTA) 30 MG capsule Take 30 mg by mouth.    . losartan-hydrochlorothiazide (HYZAAR) 50-12.5 MG tablet     . oxyCODONE-acetaminophen (PERCOCET/ROXICET) 5-325 MG tablet Take 5-325 tablets by mouth every 6 (six) hours.    Marland Kitchen atorvastatin (LIPITOR) 20 MG tablet Take 1  tablet (20 mg total) by mouth daily. 30 tablet 11   No current facility-administered medications for this visit.     Social History   Social History  . Marital status: Single    Spouse name: N/A  . Number of children: N/A  . Years of education: N/A   Occupational History  . Not on file.   Social History Main Topics  . Smoking status: Never Smoker  . Smokeless tobacco: Not on file  . Alcohol use No  . Drug use: No  . Sexual activity: Not on file   Other Topics Concern  . Not on file    Social History Narrative  . No narrative on file   Additional social history is notable in that she is divorced but lives with her ex-husband.  He has 2 children and 1 grandchild.  There is no history of tobacco use.  She does not drink alcohol.  She does not exercise regularly but walks with her daughter twice per week  Family History  Problem Relation Age of Onset  . Angina Mother    Family history is notable that both parents are deceased.  Her mother died at age 55 with colon cancer in her father died at age 86 with lung cancer.  She has a living brother rate 41.  One brother committed suicide.  Another brother died with lung cancer.  Another brother had Alzheimer's.  ROS General: Negative; No fevers, chills, or night sweats HEENT: Negative; No changes in vision or hearing, sinus congestion, difficulty swallowing Pulmonary: Negative; No cough, wheezing, shortness of breath, hemoptysis Cardiovascular:  See HPI; GI: Negative; No nausea, vomiting, diarrhea, or abdominal pain GU: Negative; No dysuria, hematuria, or difficulty voiding Musculoskeletal: Negative; no myalgias, joint pain, or weakness Hematologic/Oncologic: Negative; no easy bruising, bleeding Endocrine: Negative; no heat/cold intolerance; no diabetes Neuro: Negative; no changes in balance, headaches Skin: Negative; No rashes or skin lesions Psychiatric: Negative; No behavioral problems, depression Sleep: Negative; No daytime sleepiness, hypersomnolence, bruxism, restless legs, hypnogagnic hallucinations Other comprehensive 14 point system review is negative   Physical Exam BP (!) 148/74   Pulse 62   Ht '5\' 2"'$  (1.575 m)   Wt 153 lb (69.4 kg)   BMI 27.98 kg/m   Wt Readings from Last 3 Encounters:  10/14/15 153 lb (69.4 kg)  06/27/15 149 lb (67.6 kg)  01/25/12 151 lb 7 oz (68.7 kg)   General: Alert, oriented, no distress.  Skin: normal turgor, no rashes, warm and dry HEENT: Normocephalic, atraumatic. Pupils  equal round and reactive to light; sclera anicteric; extraocular muscles intact; Fundi Disks flat.  No AV nicking, hemorrhages or exudates. Nose without nasal septal hypertrophy Mouth/Parynx benign; Mallinpatti scale 2 Neck: No JVD, no carotid bruits; normal carotid upstroke Lungs: clear to ausculatation and percussion; no wheezing or rales Chest wall: without tenderness to palpitation Heart: PMI not displaced, RRR, s1 s2 normal, 1/6 systolic murmur in the aortic area, no diastolic murmur, no rubs, gallops, thrills, or heaves Abdomen: soft, nontender; no hepatosplenomehaly, BS+; abdominal aorta nontender and not dilated by palpation. Back: no CVA tenderness Pulses 2+ Musculoskeletal: full range of motion, normal strength, no joint deformities Extremities: no clubbing cyanosis or edema, Homan's sign negative  Neurologic: grossly nonfocal; Cranial nerves grossly wnl Psychologic: Normal Sims and affect  ECG (independently read by me): (Her ECG may have been labeled as her husbands, [name]) Normal sinus rhythm at 62 bpm.  April 2017 ECG (independently read by me): Normal sinus rhythm at  68 bpm.  Normal intervals.  No significant ST segment changes.  LABS:  BMP Latest Ref Rng & Units 06/27/2015 01/25/2012 04/29/2010  Glucose 65 - 99 mg/dL 83 102(H) 100(H)  BUN 7 - 25 mg/dL '24 8 9  '$ Creatinine 0.50 - 0.99 mg/dL 1.21(H) 0.83 0.92  Sodium 135 - 146 mmol/L 138 139 140  Potassium 3.5 - 5.3 mmol/L 4.8 3.8 4.2  Chloride 98 - 110 mmol/L 106 105 107  CO2 20 - 31 mmol/L '22 21 26  '$ Calcium 8.6 - 10.4 mg/dL 9.6 9.5 9.4     Hepatic Function Latest Ref Rng & Units 06/27/2015 04/29/2010  Total Protein 6.1 - 8.1 g/dL 7.4 7.0  Albumin 3.6 - 5.1 g/dL 4.4 4.2  AST 10 - 35 U/L 15 16  ALT 6 - 29 U/L 13 11  Alk Phosphatase 33 - 130 U/L 97 97  Total Bilirubin 0.2 - 1.2 mg/dL 0.5 0.3    CBC Latest Ref Rng & Units 06/27/2015 01/25/2012 04/29/2010  WBC 3.8 - 10.8 K/uL 6.4 8.4 6.4  Hemoglobin 11.7 - 15.5  g/dL 10.7(L) 11.5(L) 11.0(L)  Hematocrit 35.0 - 45.0 % 32.0(L) 33.5(L) 31.9(L)  Platelets 140 - 400 K/uL 203 207 199   Lab Results  Component Value Date   MCV 96.1 06/27/2015   MCV 92.5 01/25/2012   MCV 89.4 04/29/2010   Lab Results  Component Value Date   TSH 1.18 06/27/2015   No results found for: HGBA1C   BNP No results found for: BNP  ProBNP No results found for: PROBNP   Lipid Panel     Component Value Date/Time   CHOL 190 06/27/2015 1044   TRIG 148 06/27/2015 1044   HDL 52 06/27/2015 1044   CHOLHDL 3.7 06/27/2015 1044   VLDL 30 06/27/2015 1044   LDLCALC 108 06/27/2015 1044    RADIOLOGY: No results found.   ASSESSMENT AND PLAN: [name] is a 70 year old female who has a long-standing history of hypertension and has been treated with losartan HCT 50/12.5 in addition to carvedilol 6.25 mg twice a day.  She has mild hyperlipidemia and has been on atorvastatin 10 mg.  Remotely, she had undergone cardiac catheterization for somewhat atypical chest pain which revealed normal coronary arteries.  Her palpitations have improved.  I reviewed her echo Doppler study with her in detail, and this showed normal systolic function with only mild mitral regurgitation.  In the etiology of her cardiac murmur.  She had run out of her losartan and her blood pressure today was 148/74.  I am renewing her losartan HCT 50/12.5.  I reviewed her laboratory.  I am recommending further titration of atorvastatin to 20 mg for more aggressive treatment of her mildly elevated LDL.  She takes a present lambert for anxiety.  She continues to have urine.  We'll bowel syndrome and admits to occasional discomfort but denies recent diarrhea or constipation.  Her weight is stable with a BMI of 27.9.  I will see her one year for reevaluation.  Troy Sine, MD, [facility] 10/15/2015 9:12 PM

## 2017-01-21 ENCOUNTER — Encounter: Payer: Self-pay | Admitting: Cardiovascular Disease

## 2017-01-21 ENCOUNTER — Ambulatory Visit: Payer: Medicare HMO | Admitting: Cardiovascular Disease

## 2017-01-21 VITALS — BP 140/72 | HR 80 | Ht 63.0 in | Wt 162.0 lb

## 2017-01-21 DIAGNOSIS — I773 Arterial fibromuscular dysplasia: Secondary | ICD-10-CM

## 2017-01-21 DIAGNOSIS — I340 Nonrheumatic mitral (valve) insufficiency: Secondary | ICD-10-CM

## 2017-01-21 DIAGNOSIS — R002 Palpitations: Secondary | ICD-10-CM

## 2017-01-21 DIAGNOSIS — I10 Essential (primary) hypertension: Secondary | ICD-10-CM | POA: Diagnosis not present

## 2017-01-21 DIAGNOSIS — E785 Hyperlipidemia, unspecified: Secondary | ICD-10-CM

## 2017-01-21 MED ORDER — CARVEDILOL 12.5 MG PO TABS: 12.5000 mg | ORAL_TABLET | Freq: Two times a day (BID) | ORAL | 3 refills | Status: DC

## 2017-01-21 NOTE — Patient Instructions (Signed)
Medication Instructions:  INCREASE carvedilol (Coreg) to 12.5 mg two times daily  Follow-Up: Your physician wants you to follow-up in: 12 months with Dr. Tresa EndoKelly. You will receive a reminder letter in the mail two months in advance. If you don't receive a letter, please call our office to schedule the follow-up appointment.   Any Other Special Instructions Will Be Listed Below (If Applicable).     If you need a refill on your cardiac medications before your next appointment, please call your pharmacy. '

## 2017-01-21 NOTE — Progress Notes (Signed)
Patient ID: SYNDA BAGENT, female   DOB: 1945-04-12, 71 y.o.   MRN: [id number]     Primary MD: Dr. Montine Circle  PATIENT PROFILE: [name] is a 71 y.o. female who is a former patient of Dr. Rex Kras, and last saw him in 2012.  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  She presents for a 15 month follow-up cardiology evaluation  HPI:  [name] has a history of hypertension in the past had episodes of chest pressure.  In October 2010.  She underwent cardiac catheterization by Dr. Rex Kras which showed normal coronary arteries.  She had hyperdynamic LV function.  There was a questionable region of fibromuscular dysplasia of her left renal artery.  Subsequent Doppler studies have shown less than 60% narrowing of her renal arteries and suggested possible fibromuscular dysplasia.  She denies any recent history of exertionally precipitated chest pain.  She has a history of palpitations.  She has noticed some short fleeting episodes of nonexertional chest discomfort.  Typically palpitations occur at least once per week.  She denies associated presyncope or syncope.   She presented to establish care with me in 2017.  When I saw her, I scheduled her for an echo Doppler study which was done on 07/16/2015.  This revealed normal systolic function with grade 1 diastolic dysfunction.  There was mild mitral regurgitation with trivial tricuspid regurgitation.  She had laboratory which revealed anemia.  There was renal insufficiency with a creatinine of 1.21.  Lipid studies revealed cholesterol of 190 with triglycerides 148, HDL 52, and LDL 10 weight.  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  She admits to shortness of breath with lying down but denies any exertional shortness of breath.  She had been on losartan HCT 50/12.5 in addition to carvedilol 6.25 mg twice a day for hypertension but ran out of  her losartan HCT several days ago.  She was on atorvastatin 10 mg for hyperlipidemia.   Since I last saw her in August 2017.  She has felt well.  She experiences occasional palpitations.  Has been on carvedilol at 6.25 mg twice a day as well as losartan HCT 50/12.5.  She continues to be on atorvastatin 20 mg for hyperlipidemia.  She has asthma and is on Singulair.  She states her primary physician has checked laboratory and she has anemia.  At times she notes fatigue.  She denies  difficulty with sleep.  She presents for evaluation.  Past Medical History:  Diagnosis Date  . Chest pain 06/26/2015  . H/O non anemic vitamin B12 deficiency     Past Surgical History:  Procedure Laterality Date  . ABDOMINAL HYSTERECTOMY    . APPENDECTOMY    . CARDIAC CATHETERIZATION  01/16/2009  . CHOLECYSTECTOMY    . DOPPLER ECHOCARDIOGRAPHY     08/07/2009, 02/06/2009, 05/04/2007, 05/03/2007  . OTHER SURGICAL HISTORY  01/25/2012   ct angiogram   . OTHER SURGICAL HISTORY  04/07/2007   echocardiography    Allergies  Allergen Reactions  . Omnicef [Cefdinir] Anaphylaxis, Swelling and Rash  . Penicillins Diarrhea  . Valium Other (See Comments)    hypotension  . Gabapentin Palpitations  . Hydromorphone Palpitations    Current Outpatient Medications  Medication Sig Dispense Refill  . azelastine (OPTIVAR) 0.05 % ophthalmic solution     . carvedilol (COREG) 12.5 MG tablet Take 1 tablet (12.5 mg total)  2 (two) times daily with a meal by mouth. 180 tablet 3  . cetirizine (ZYRTEC) 10 MG tablet Take 10 mg daily by mouth.    . DULoxetine (CYMBALTA) 30 MG capsule Take 30 mg by mouth.    . losartan-hydrochlorothiazide (HYZAAR) 50-12.5 MG tablet Take 1 tablet daily by mouth.     . meloxicam (MOBIC) 7.5 MG tablet Take 7.5 mg daily by mouth.    . mirtazapine (REMERON) 7.5 MG tablet Take 7.5 mg at bedtime by mouth.    . montelukast (SINGULAIR) 10 MG tablet Take 10 mg at bedtime by mouth.    . oxyCODONE-acetaminophen  (PERCOCET/ROXICET) 5-325 MG tablet Take 5-325 tablets by mouth every 6 (six) hours.    Marland Kitchen atorvastatin (LIPITOR) 20 MG tablet Take 1 tablet (20 mg total) by mouth daily. 30 tablet 11   No current facility-administered medications for this visit.     Social History   Socioeconomic History  . Marital status: Single    Spouse name: Not on file  . Number of children: Not on file  . Years of education: Not on file  . Highest education level: Not on file  Social Needs  . Financial resource strain: Not on file  . Food insecurity - worry: Not on file  . Food insecurity - inability: Not on file  . Transportation needs - medical: Not on file  . Transportation needs - non-medical: Not on file  Occupational History  . Not on file  Tobacco Use  . Smoking status: Never Smoker  . Smokeless tobacco: Never Used  Substance and Sexual Activity  . Alcohol use: No  . Drug use: No  . Sexual activity: Not on file  Other Topics Concern  . Not on file  Social History Narrative  . Not on file   Additional social history is notable in that she is divorced but lives with her ex-husband.  He has 2 children and 1 grandchild.  There is no history of tobacco use.  She does not drink alcohol.  She does not exercise regularly but walks with her daughter twice per week  Family History  Problem Relation Age of Onset  . Angina Mother    Family history is notable that both parents are deceased.  Her mother died at age 72 with colon cancer in her father died at age 64 with lung cancer.  She has a living brother rate 36.  One brother committed suicide.  Another brother died with lung cancer.  Another brother had Alzheimer's.  ROS General: Negative; No fevers, chills, or night sweats HEENT: Negative; No changes in vision or hearing, sinus congestion, difficulty swallowing Pulmonary: Negative; No cough, wheezing, shortness of breath, hemoptysis Cardiovascular:  See HPI; GI: Negative; No nausea, vomiting,  diarrhea, or abdominal pain GU: Negative; No dysuria, hematuria, or difficulty voiding Musculoskeletal: Negative; no myalgias, joint pain, or weakness Hematologic/Oncologic: Negative; no easy bruising, bleeding Endocrine: Negative; no heat/cold intolerance; no diabetes Neuro: Negative; no changes in balance, headaches Skin: Negative; No rashes or skin lesions Psychiatric: Negative; No behavioral problems, depression Sleep: Negative; No daytime sleepiness, hypersomnolence, bruxism, restless legs, hypnogagnic hallucinations Other comprehensive 14 point system review is negative   Physical Exam BP 140/72   Pulse 80   Ht '5\' 3"'$  (1.6 m)   Wt 162 lb (73.5 kg)   BMI 28.70 kg/m   Repeat blood pressure by me was 142/74  Wt Readings from Last 3 Encounters:  01/21/17 162 lb (73.5 kg)  10/14/15 153 lb (69.4  kg)  06/27/15 149 lb (67.6 kg)   General: Alert, oriented, no distress.  Skin: normal turgor, no rashes, warm and dry HEENT: Normocephalic, atraumatic. Pupils equal round and reactive to light; sclera anicteric; extraocular muscles intact;  Nose without nasal septal hypertrophy Mouth/Parynx benign; Mallinpatti scale 2 Neck: No JVD, no carotid bruits; normal carotid upstroke Lungs: clear to ausculatation and percussion; no wheezing or rales Chest wall: without tenderness to palpitation Heart: PMI not displaced, RRR, s1 s2 normal, 1/6 systolic murmur, no diastolic murmur, no rubs, gallops, thrills, or heaves Abdomen: soft, nontender; no hepatosplenomehaly, BS+; abdominal aorta nontender and not dilated by palpation. Back: no CVA tenderness Pulses 2+ Musculoskeletal: full range of motion, normal strength, no joint deformities Extremities: no clubbing cyanosis or edema, Homan's sign negative  Neurologic: grossly nonfocal; Cranial nerves grossly wnl Psychologic: Normal mood and affect   ECG (independently read by me): Sinus rhythm at 80 bpm.  Normal intervals.  No ST segment  changes.  August 2017 ECG (independently read by me): (Her ECG may have been labeled as her husbands, [name]) Normal sinus rhythm at 62 bpm.  April 2017 ECG (independently read by me): Normal sinus rhythm at 68 bpm.  Normal intervals.  No significant ST segment changes.  LABS:  BMP Latest Ref Rng & Units 06/27/2015 01/25/2012 04/29/2010  Glucose 65 - 99 mg/dL 83 102(H) 100(H)  BUN 7 - 25 mg/dL '24 8 9  '$ Creatinine 0.50 - 0.99 mg/dL 1.21(H) 0.83 0.92  Sodium 135 - 146 mmol/L 138 139 140  Potassium 3.5 - 5.3 mmol/L 4.8 3.8 4.2  Chloride 98 - 110 mmol/L 106 105 107  CO2 20 - 31 mmol/L '22 21 26  '$ Calcium 8.6 - 10.4 mg/dL 9.6 9.5 9.4     Hepatic Function Latest Ref Rng & Units 06/27/2015 04/29/2010  Total Protein 6.1 - 8.1 g/dL 7.4 7.0  Albumin 3.6 - 5.1 g/dL 4.4 4.2  AST 10 - 35 U/L 15 16  ALT 6 - 29 U/L 13 11  Alk Phosphatase 33 - 130 U/L 97 97  Total Bilirubin 0.2 - 1.2 mg/dL 0.5 0.3    CBC Latest Ref Rng & Units 06/27/2015 01/25/2012 04/29/2010  WBC 3.8 - 10.8 K/uL 6.4 8.4 6.4  Hemoglobin 11.7 - 15.5 g/dL 10.7(L) 11.5(L) 11.0(L)  Hematocrit 35.0 - 45.0 % 32.0(L) 33.5(L) 31.9(L)  Platelets 140 - 400 K/uL 203 207 199   Lab Results  Component Value Date   MCV 96.1 06/27/2015   MCV 92.5 01/25/2012   MCV 89.4 04/29/2010   Lab Results  Component Value Date   TSH 1.18 06/27/2015   No results found for: HGBA1C   BNP No results found for: BNP  ProBNP No results found for: PROBNP   Lipid Panel     Component Value Date/Time   CHOL 190 06/27/2015 1044   TRIG 148 06/27/2015 1044   HDL 52 06/27/2015 1044   CHOLHDL 3.7 06/27/2015 1044   VLDL 30 06/27/2015 1044   LDLCALC 108 06/27/2015 1044    RADIOLOGY: No results found.  IMPRESSION: 1. Essential hypertension, benign   2. Palpitations   3. Hyperlipidemia, unspecified hyperlipidemia type   4. Mild mitral regurgitation   5. Fibromuscular dysplasia of renal artery [facility])     ASSESSMENT AND PLAN: [name] is  a 71 year old female who has a long-standing history of hypertension and has been treated with losartan HCT 50/12.5 in addition to carvedilol 6.25 mg twice a day.  She has mild hyperlipidemia and  has been on atorvastatin.  When I last saw her, I further titrated atorvastatin to 20 mg for more aggressive lipid lowering.  Remotely, she had undergone cardiac catheterization for somewhat atypical chest pain which revealed normal coronary arteries.  Her echo Doppler study in May 2017 showed an EF of 55-60% with grade 1 diastolic dysfunction and mild mitral regurgitation.  I again reviewed this with her.  She has noticed occasional palpitations.  Her blood pressure today is mildly elevated.  For these reasons, I am further titrating carvedilol up to 12.5 mg twice a day.  She tells me she had recent laboratory done by her primary physician.  I will try to obtain these results.  Her BMI is 28.7.  She denies any recent wheezing.  As long as she is stable, I will see her in one year for reevaluation.  Time spent: 25 minutes Troy Sine, MD, [facility] 01/23/2017 7:53 PM

## 2017-01-23 ENCOUNTER — Encounter: Payer: Self-pay | Admitting: Cardiovascular Disease

## 2018-01-09 ENCOUNTER — Other Ambulatory Visit: Payer: Self-pay | Admitting: Cardiovascular Disease

## 2018-04-17 ENCOUNTER — Other Ambulatory Visit: Payer: Self-pay | Admitting: Cardiovascular Disease

## 2018-05-19 ENCOUNTER — Ambulatory Visit (INDEPENDENT_AMBULATORY_CARE_PROVIDER_SITE_OTHER): Payer: Medicare HMO | Admitting: Cardiovascular Disease

## 2018-05-19 ENCOUNTER — Other Ambulatory Visit: Payer: Self-pay

## 2018-05-19 ENCOUNTER — Encounter: Payer: Self-pay | Admitting: Cardiovascular Disease

## 2018-05-19 VITALS — BP 162/84 | HR 62 | Ht 63.0 in | Wt 165.4 lb

## 2018-05-19 DIAGNOSIS — E785 Hyperlipidemia, unspecified: Secondary | ICD-10-CM | POA: Diagnosis not present

## 2018-05-19 DIAGNOSIS — I519 Heart disease, unspecified: Secondary | ICD-10-CM

## 2018-05-19 DIAGNOSIS — I5189 Other ill-defined heart diseases: Secondary | ICD-10-CM

## 2018-05-19 DIAGNOSIS — I340 Nonrheumatic mitral (valve) insufficiency: Secondary | ICD-10-CM

## 2018-05-19 DIAGNOSIS — I10 Essential (primary) hypertension: Secondary | ICD-10-CM

## 2018-05-19 DIAGNOSIS — R002 Palpitations: Secondary | ICD-10-CM | POA: Diagnosis not present

## 2018-05-19 DIAGNOSIS — F329 Major depressive disorder, single episode, unspecified: Secondary | ICD-10-CM

## 2018-05-19 DIAGNOSIS — F32A Depression, unspecified: Secondary | ICD-10-CM

## 2018-05-19 MED ORDER — OLMESARTAN MEDOXOMIL 20 MG PO TABS: 20.0000 mg | ORAL_TABLET | Freq: Every day | ORAL | 2 refills | Status: DC

## 2018-05-19 NOTE — Patient Instructions (Signed)
Medication Instructions:  Start Olmesartan 20 mg daily.  If you need a refill on your cardiac medications before your next appointment, please call your pharmacy.   Lab work: Fasting lab work (CBC, CMET, TSH, LIPID)  If you have labs (blood work) drawn today and your tests are completely normal, you will receive your results only by: Marland Kitchen MyChart Message (if you have MyChart) OR . A paper copy in the mail If you have any lab test that is abnormal or we need to change your treatment, we will call you to review the results.  Testing/Procedures: Echocardiogram - Your physician has requested that you have an echocardiogram. Echocardiography is a painless test that uses sound waves to create images of your heart. It provides your doctor with information about the size and shape of your heart and how well your heart's chambers and valves are working. This procedure takes approximately one hour. There are no restrictions for this procedure. This will be performed at our [facility] location - [address], [address].   Follow-Up: At [facility], you and your health needs are our priority.  As part of our continuing mission to provide you with exceptional heart care, we have created designated Provider Care Teams.  These Care Teams include your primary Cardiologist (physician) and Advanced Practice Providers (APPs -  Physician Assistants and Nurse Practitioners) who all work together to provide you with the care you need, when you need it. You will need a follow up appointment in 3-4 months.  Please call our office 2 months in advance to schedule this appointment.  You may see Dr.Kelly or one of the following Advanced Practice Providers on your designated Care Team: Azalee Course, [place] . Micah Flesher, PA-C  Any Other Special Instructions Will Be Listed Below (If Applicable). Scheduling will contact you regarding the appointment for the ECHO, and the follow up with Dr.Kelly.

## 2018-05-20 NOTE — Progress Notes (Signed)
Patient ID: [name], female   DOB: 06-02-1945, 73 y.o.   MRN: [id number]     Primary MD: Dr. Montine Circle  PATIENT PROFILE: [name] is a 73 y.o. female who is a former patient of Dr. Rex Kras.  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  I last saw her in November 2018.  She presents for a 16 month follow-up cardiology evaluation  HPI:  [name] has a history of hypertension in the past had episodes of chest pressure.  In October 2010.  She underwent cardiac catheterization by Dr. Rex Kras which showed normal coronary arteries.  She had hyperdynamic LV function.  There was a questionable region of fibromuscular dysplasia of her left renal artery.  Subsequent Doppler studies have shown less than 60% narrowing of her renal arteries and suggested possible fibromuscular dysplasia.  She denies any recent history of exertionally precipitated chest pain.  She has a history of palpitations.  She has noticed some short fleeting episodes of nonexertional chest discomfort.  Typically palpitations occur at least once per week.  She denies associated presyncope or syncope.   She presented to establish care with me in 2017.  When I saw her, I scheduled her for an echo Doppler study which was done on 07/16/2015.  This revealed normal systolic function with grade 1 diastolic dysfunction.  There was mild mitral regurgitation with trivial tricuspid regurgitation.  She had laboratory which revealed anemia.  There was renal insufficiency with a creatinine of 1.21.  Lipid studies revealed cholesterol of 190 with triglycerides 148, HDL 52, and LDL 108.  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  She admits to shortness of breath with lying down but denies any exertional shortness of breath.  She had been on losartan HCT 50/12.5 in addition to carvedilol 6.25 mg twice a day for hypertension but ran out of  her losartan HCT several days ago.  She was on atorvastatin 10 mg for hyperlipidemia.   When I saw her in May 2018 she was experiencing occasional palpitations and was treated with carvedilol 6.25 mg twice a day as well as losartan HCT 50/12.5 for hypertension.  At her last office visit in November 2018 her blood pressure was elevated and I further titrated carvedilol to 12.5 mg twice a day.  She has been on atorvastatin for hyperlipidemia.  She has a history of depression for which she is on Cymbalta.  She has been taking meloxicam 7.5 mg for arthritis.  She presents for evaluation.  Past Medical History:  Diagnosis Date  . Chest pain 06/26/2015  . H/O non anemic vitamin B12 deficiency     Past Surgical History:  Procedure Laterality Date  . ABDOMINAL HYSTERECTOMY    . APPENDECTOMY    . CARDIAC CATHETERIZATION  01/16/2009  . CHOLECYSTECTOMY    . DOPPLER ECHOCARDIOGRAPHY     08/07/2009, 02/06/2009, 05/04/2007, 05/03/2007  . OTHER SURGICAL HISTORY  01/25/2012   ct angiogram   . OTHER SURGICAL HISTORY  04/07/2007   echocardiography    Allergies  Allergen Reactions  . Cefdinir Anaphylaxis, Swelling, Rash, Itching, Palpitations and Hives  . Diazepam Swelling and Other (See Comments)    hypotension Other reaction(s): bp dropped Other reaction(s): bp dropped hypotension Other reaction(s): bp dropped  . Gabapentin Palpitations  . Other Palpitations  . Trazodone And Nefazodone Palpitations  . Penicillins Diarrhea and Nausea And Vomiting  .  Valium Other (See Comments)    hypotension  . Amoxicillin-Pot Clavulanate Diarrhea  . Hydromorphone Palpitations    Current Outpatient Medications  Medication Sig Dispense Refill  . atorvastatin (LIPITOR) 20 MG tablet Take 1 tablet (20 mg total) by mouth daily. 30 tablet 11  . azelastine (OPTIVAR) 0.05 % ophthalmic solution     . carvedilol (COREG) 12.5 MG tablet TAKE 1 TABLET (12.5 MG TOTAL) 2 (TWO) TIMES DAILY WITH A MEAL BY MOUTH. 180 tablet 0   . DULoxetine (CYMBALTA) 30 MG capsule Take 30 mg by mouth.    . meloxicam (MOBIC) 7.5 MG tablet Take 7.5 mg daily by mouth.    . mirtazapine (REMERON) 7.5 MG tablet Take 7.5 mg at bedtime by mouth.    . montelukast (SINGULAIR) 10 MG tablet Take 10 mg at bedtime by mouth.    . oxyCODONE-acetaminophen (PERCOCET/ROXICET) 5-325 MG tablet Take 5-325 tablets by mouth every 6 (six) hours.    Marland Kitchen olmesartan (BENICAR) 20 MG tablet Take 1 tablet (20 mg total) by mouth daily. 90 tablet 2   No current facility-administered medications for this visit.     Social History   Socioeconomic History  . Marital status: Single    Spouse name: Not on file  . Number of children: Not on file  . Years of education: Not on file  . Highest education level: Not on file  Occupational History  . Not on file  Social Needs  . Financial resource strain: Not on file  . Food insecurity:    Worry: Not on file    Inability: Not on file  . Transportation needs:    Medical: Not on file    Non-medical: Not on file  Tobacco Use  . Smoking status: Never Smoker  . Smokeless tobacco: Never Used  Substance and Sexual Activity  . Alcohol use: No  . Drug use: No  . Sexual activity: Not on file  Lifestyle  . Physical activity:    Days per week: Not on file    Minutes per session: Not on file  . Stress: Not on file  Relationships  . Social connections:    Talks on phone: Not on file    Gets together: Not on file    Attends religious service: Not on file    Active member of club or organization: Not on file    Attends meetings of clubs or organizations: Not on file    Relationship status: Not on file  . Intimate partner violence:    Fear of current or ex partner: Not on file    Emotionally abused: Not on file    Physically abused: Not on file    Forced sexual activity: Not on file  Other Topics Concern  . Not on file  Social History Narrative  . Not on file   Additional social history is notable in that she  is divorced but lives with her ex-husband.  He has 2 children and 1 grandchild.  There is no history of tobacco use.  She does not drink alcohol.  She does not exercise regularly but walks with her daughter twice per week  Family History  Problem Relation Age of Onset  . Angina Mother    Family history is notable that both parents are deceased.  Her mother died at age 38 with colon cancer in her father died at age 3 with lung cancer.  She has a living brother rate 49.  One brother committed suicide.  Another brother died  with lung cancer.  Another brother had Alzheimer's.  ROS General: Negative; No fevers, chills, or night sweats HEENT: Negative; No changes in vision or hearing, sinus congestion, difficulty swallowing Pulmonary: Negative; No cough, wheezing, shortness of breath, hemoptysis Cardiovascular:  See HPI; GI: Negative; No nausea, vomiting, diarrhea, or abdominal pain GU: Negative; No dysuria, hematuria, or difficulty voiding Musculoskeletal: Positive for arthritis Hematologic/Oncologic: Negative; no easy bruising, bleeding Endocrine: Negative; no heat/cold intolerance; no diabetes Neuro: Negative; no changes in balance, headaches Skin: Negative; No rashes or skin lesions Psychiatric: Positive for depression Sleep: Negative; No daytime sleepiness, hypersomnolence, bruxism, restless legs, hypnogagnic hallucinations Other comprehensive 14 point system review is negative   Physical Exam BP (!) 162/84   Pulse 62   Ht '5\' 3"'$  (1.6 m)   Wt 165 lb 6.4 oz (75 kg)   SpO2 98%   BMI 29.30 kg/m   Repeat blood pressure by me was elevated at 162/84.  Wt Readings from Last 3 Encounters:  05/19/18 165 lb 6.4 oz (75 kg)  01/21/17 162 lb (73.5 kg)  10/14/15 153 lb (69.4 kg)   General: Alert, oriented, no distress.  Skin: normal turgor, no rashes, warm and dry HEENT: Normocephalic, atraumatic. Pupils equal round and reactive to light; sclera anicteric; extraocular muscles intact;   Nose without nasal septal hypertrophy Mouth/Parynx benign; Mallinpatti scale 3 Neck: No JVD, no carotid bruits; normal carotid upstroke Lungs: clear to ausculatation and percussion; no wheezing or rales Chest wall: without tenderness to palpitation Heart: PMI not displaced, RRR, s1 s2 normal, 1/6 systolic murmur, no diastolic murmur, no rubs, gallops, thrills, or heaves Abdomen: soft, nontender; no hepatosplenomehaly, BS+; abdominal aorta nontender and not dilated by palpation. Back: no CVA tenderness Pulses 2+ Musculoskeletal: full range of motion, normal strength, no joint deformities Extremities: no clubbing cyanosis or edema, Homan's sign negative  Neurologic: grossly nonfocal; Cranial nerves grossly wnl Psychologic: Normal mood and affect   ECG (independently read by me): Normal sinus rhythm at 62 bpm.  Normal intervals.  No ectopy.  November 2018 ECG (independently read by me): Sinus rhythm at 80 bpm.  Normal intervals.  No ST segment changes.  August 2017 ECG (independently read by me): (Her ECG may have been labeled as her husbands, [name]) Normal sinus rhythm at 62 bpm.  April 2017 ECG (independently read by me): Normal sinus rhythm at 68 bpm.  Normal intervals.  No significant ST segment changes.  LABS:  BMP Latest Ref Rng & Units 06/27/2015 01/25/2012 04/29/2010  Glucose 65 - 99 mg/dL 83 102(H) 100(H)  BUN 7 - 25 mg/dL '24 8 9  '$ Creatinine 0.50 - 0.99 mg/dL 1.21(H) 0.83 0.92  Sodium 135 - 146 mmol/L 138 139 140  Potassium 3.5 - 5.3 mmol/L 4.8 3.8 4.2  Chloride 98 - 110 mmol/L 106 105 107  CO2 20 - 31 mmol/L '22 21 26  '$ Calcium 8.6 - 10.4 mg/dL 9.6 9.5 9.4     Hepatic Function Latest Ref Rng & Units 06/27/2015 04/29/2010  Total Protein 6.1 - 8.1 g/dL 7.4 7.0  Albumin 3.6 - 5.1 g/dL 4.4 4.2  AST 10 - 35 U/L 15 16  ALT 6 - 29 U/L 13 11  Alk Phosphatase 33 - 130 U/L 97 97  Total Bilirubin 0.2 - 1.2 mg/dL 0.5 0.3    CBC Latest Ref Rng & Units 06/27/2015 01/25/2012  04/29/2010  WBC 3.8 - 10.8 K/uL 6.4 8.4 6.4  Hemoglobin 11.7 - 15.5 g/dL 10.7(L) 11.5(L) 11.0(L)  Hematocrit 35.0 - 45.0 %  32.0(L) 33.5(L) 31.9(L)  Platelets 140 - 400 K/uL 203 207 199   Lab Results  Component Value Date   MCV 96.1 06/27/2015   MCV 92.5 01/25/2012   MCV 89.4 04/29/2010   Lab Results  Component Value Date   TSH 1.18 06/27/2015   No results found for: HGBA1C   BNP No results found for: BNP  ProBNP No results found for: PROBNP   Lipid Panel     Component Value Date/Time   CHOL 190 06/27/2015 1044   TRIG 148 06/27/2015 1044   HDL 52 06/27/2015 1044   CHOLHDL 3.7 06/27/2015 1044   VLDL 30 06/27/2015 1044   LDLCALC 108 06/27/2015 1044    RADIOLOGY: No results found.  IMPRESSION: 1. Essential hypertension   2. Palpitations   3. Mild mitral regurgitation   4. Hyperlipidemia LDL goal <70   5. Grade I diastolic dysfunction   6. Depression, unspecified depression type     ASSESSMENT AND PLAN: Ms. [name] is a 73 year old female who has a long-standing history of hypertension and had been treated with losartan HCT 50/12.5 in addition to carvedilol 6.25 mg twice a day.  She has mild hyperlipidemia and has been on atorvastatin.  When I last saw her, I further titrated atorvastatin to 20 mg for more aggressive lipid lowering.  Remotely, she had undergone cardiac catheterization for somewhat atypical chest pain which revealed normal coronary arteries.  Her echo Doppler study in May 2017 showed an EF of 55-60% with grade 1 diastolic dysfunction and mild mitral regurgitation.  When last seen I had further titrated her carvedilol to 12.5 mg twice a day both for blood pressure as well as palpitations.  She states recently she has noticed her blood pressure at times increasing to 160.  On repeat evaluation by me her blood pressure was elevated at 162/84.  She has not had recent laboratory.  I am recommending she discontinue her losartan.  I have started her on  olmesartan initially at 20 mg.  She will continue taking carvedilol at the current dose of 12.5 mg twice a day.  I am checking a complete set of fasting laboratory.  I am also recommending an echo Doppler study to assess hypertensive cardiovascular disease, systolic and diastolic function and valvular architecture.  Her last echo in 2017 revealed grade 1 diastolic dysfunction with mild MR.  She will monitor her blood pressure.  She continues to be on atorvastatin 20 mg for hyperlipidemia but she has not had follow-up laboratory on this dose.  She is on Cymbalta for depression.  I will see her with her husband in 3 to 4 months for follow-up evaluation.  Time spent: 25 minutes Troy Sine, MD, [facility] 05/21/2018 9:11 AM

## 2018-05-21 ENCOUNTER — Encounter: Payer: Self-pay | Admitting: Cardiovascular Disease

## 2018-05-26 ENCOUNTER — Telehealth: Payer: Self-pay | Admitting: Internal Medicine

## 2018-05-26 NOTE — Telephone Encounter (Signed)
Spoke to patient's husband [name] reschedule echo as well until later in spring (may) when safer from infection standpoint

## 2018-05-27 ENCOUNTER — Ambulatory Visit (HOSPITAL_COMMUNITY): Payer: Medicare HMO

## 2018-07-11 ENCOUNTER — Other Ambulatory Visit: Payer: Self-pay | Admitting: Cardiovascular Disease

## 2018-07-15 ENCOUNTER — Telehealth (HOSPITAL_COMMUNITY): Payer: Self-pay

## 2018-07-15 NOTE — Telephone Encounter (Signed)

## 2018-07-18 ENCOUNTER — Other Ambulatory Visit: Payer: Self-pay

## 2018-07-18 ENCOUNTER — Encounter (INDEPENDENT_AMBULATORY_CARE_PROVIDER_SITE_OTHER): Payer: Self-pay

## 2018-07-18 ENCOUNTER — Ambulatory Visit (HOSPITAL_COMMUNITY): Payer: Medicare HMO | Attending: Cardiovascular Disease

## 2018-07-18 DIAGNOSIS — I340 Nonrheumatic mitral (valve) insufficiency: Secondary | ICD-10-CM | POA: Diagnosis not present

## 2018-07-18 DIAGNOSIS — E785 Hyperlipidemia, unspecified: Secondary | ICD-10-CM | POA: Diagnosis not present

## 2018-07-19 ENCOUNTER — Other Ambulatory Visit (HOSPITAL_COMMUNITY): Payer: Medicare HMO

## 2018-08-25 ENCOUNTER — Telehealth: Payer: Medicare HMO | Admitting: Cardiovascular Disease

## 2018-09-01 ENCOUNTER — Telehealth: Payer: Self-pay | Admitting: Physician Assistant

## 2018-09-01 NOTE — Telephone Encounter (Signed)
Patient is returning your call.  

## 2018-09-01 NOTE — Progress Notes (Signed)
Virtual Visit via Telephone Note   This visit type was conducted due to national recommendations for restrictions regarding the COVID-19 Pandemic (e.g. social distancing) in an effort to limit this patient's exposure and mitigate transmission in our community.  Due to her co-morbid illnesses, this patient is at least at moderate risk for complications without adequate follow up.  This format is felt to be most appropriate for this patient at this time.  The patient did not have access to video technology/had technical difficulties with video requiring transitioning to audio format only (telephone).  All issues noted in this document were discussed and addressed.  No physical exam could be performed with this format.  Please refer to the patient's chart for her  consent to telehealth for [facility].   Date:  09/02/2018   ID:  [name], DOB 01/09/1946, MRN [id number]  Patient Location: Home Provider Location: Office  PCP:  Eliott NineLeague-Sobon, Jennifer, MD  Cardiologist:  Nicki Guadalajarahomas Kelly, MD  Electrophysiologist:  None   Evaluation Performed:  Follow-Up Visit  Chief Complaint:  HTN, palpitations  History of Present Illness:    [name] is a 73 y.o. female with a history of essential hypertension, mild mitral regurgitation, fibromuscular dysplasia of renal arteries, hyperlipidemia.  She had normal coronaries by heart cath in October 2010.  Echocardiogram in 2017 with normal EF and grade 1 diastolic dysfunction, mild mitral regurgitation and trivial tricuspid regurgitation.  In 2018, her palpitations have been controlled with carvedilol for hypertension controlled with losartan HCTZ 50-12.5.  Last seen by Dr. Tresa EndoKelly in clinic on 05/19/2018.  Her ARB was changed to olmesartan 20 mg, she was also taking carvedilol 12.5 mg twice daily. She had a repeat echocardiogram 07/18/18.   She presents for follow up and echo results. She presents with her husband [name].  Echocardiogram revealed preserved  EF of 60 to 65%, diastolic dysfunction, and mild aortic stenosis.  She is doing very well from a cardiac perspective. No complaints. Does report epigastric pain with eating and IBS. She will touch base with PCP about this.  The patient does not have symptoms concerning for COVID-19 infection (fever, chills, cough, or new shortness of breath).    Past Medical History:  Diagnosis Date  . Chest pain 06/26/2015  . H/O non anemic vitamin B12 deficiency    Past Surgical History:  Procedure Laterality Date  . ABDOMINAL HYSTERECTOMY    . APPENDECTOMY    . CARDIAC CATHETERIZATION  01/16/2009  . CHOLECYSTECTOMY    . DOPPLER ECHOCARDIOGRAPHY     08/07/2009, 02/06/2009, 05/04/2007, 05/03/2007  . OTHER SURGICAL HISTORY  01/25/2012   ct angiogram   . OTHER SURGICAL HISTORY  04/07/2007   echocardiography     Current Meds  Medication Sig  . atorvastatin (LIPITOR) 20 MG tablet Take 1 tablet (20 mg total) by mouth daily.  . carvedilol (COREG) 12.5 MG tablet TAKE 1 TABLET (12.5 MG TOTAL) 2 (TWO) TIMES DAILY WITH A MEAL BY MOUTH.  . DULoxetine (CYMBALTA) 30 MG capsule Take 30 mg by mouth.  . meloxicam (MOBIC) 7.5 MG tablet Take 7.5 mg daily by mouth.  . mirtazapine (REMERON) 7.5 MG tablet Take 7.5 mg at bedtime by mouth.  . montelukast (SINGULAIR) 10 MG tablet Take 10 mg at bedtime by mouth.  . olmesartan (BENICAR) 20 MG tablet Take 1 tablet (20 mg total) by mouth daily.  Marland Kitchen. oxyCODONE-acetaminophen (PERCOCET/ROXICET) 5-325 MG tablet Take 5-325 tablets by mouth every 6 (six) hours.  Allergies:   Cefdinir, Diazepam, Gabapentin, Other, Trazodone and nefazodone, Penicillins, Valium, Amoxicillin-pot clavulanate, and Hydromorphone   Social History   Tobacco Use  . Smoking status: Never Smoker  . Smokeless tobacco: Never Used  Substance Use Topics  . Alcohol use: No  . Drug use: No     Family Hx: The patient's family history includes Angina in her mother.  ROS:   Please see the history of present  illness.     All other systems reviewed and are negative.   Prior CV studies:   The following studies were reviewed today:  Echo 07/18/18:  1. The left ventricle has normal systolic function with an ejection fraction of 60-65%. The cavity size was normal. Left ventricular diastolic Doppler parameters are consistent with impaired relaxation.  2. The right ventricle has normal systolic function. The cavity was normal. There is no increase in right ventricular wall thickness.  3. The aortic valve is tricuspid. Moderate thickening of the aortic valve. Moderate calcification of the aortic valve. Mild stenosis of the aortic valve.   Labs/Other Tests and Data Reviewed:    EKG:  No ECG reviewed.  Recent Labs: No results found for requested labs within last 8760 hours.   Recent Lipid Panel Lab Results  Component Value Date/Time   CHOL 190 06/27/2015 10:44 AM   TRIG 148 06/27/2015 10:44 AM   HDL 52 06/27/2015 10:44 AM   CHOLHDL 3.7 06/27/2015 10:44 AM   LDLCALC 108 06/27/2015 10:44 AM    Wt Readings from Last 3 Encounters:  09/02/18 160 lb (72.6 kg)  05/19/18 165 lb 6.4 oz (75 kg)  01/21/17 162 lb (73.5 kg)     Objective:    Vital Signs:  BP 135/82   Pulse 90   Ht 5\' 3"  (1.6 m)   Wt 160 lb (72.6 kg)   BMI 28.34 kg/m    VITAL SIGNS:  reviewed GEN:  no acute distress RESPIRATORY:  normal respiratory effort, symmetric expansion NEURO:  alert and oriented x 3, no obvious focal deficit PSYCH:  normal affect  ASSESSMENT & PLAN:     Hypertension Benicar 20 mg BP has been elevated with sinus pressure in the 789F systolic. 810-175 systolic normally.  Will increase benicar to 1.5 tablets / 30 mg.    Palpitations Coreg 12.5 mg BID Stable, no change.   Hyperlipidemia Continue lipitor 20 mg. Hasn't been checked since 2017.  LDL was 108. Will defer to PCP.    Chronic diastolic heart failure Stable, euvolemic today.   Mild aortic stenosis Stable on  echo.   Fibromuscular dysplasia of renal arteries She states her PCP is following her renal function. I will defer labs to them. Would like to see a renal function since she is on ARB.    Follow up with Dr. Claiborne Billings in 1 year.   COVID-19 Education: The signs and symptoms of COVID-19 were discussed with the patient and how to seek care for testing (follow up with PCP or arrange E-visit).  The importance of social distancing was discussed today.  Time:   Today, I have spent 15 minutes with the patient with telehealth technology discussing the above problems.     Medication Adjustments/Labs and Tests Ordered: Current medicines are reviewed at length with the patient today.  Concerns regarding medicines are outlined above.   Tests Ordered: No orders of the defined types were placed in this encounter.   Medication Changes: No orders of the defined types were placed in this encounter.  Follow Up:  Virtual Visit or In Person in 1 year(s)  Signed, Marcelino Dusterngela Nicole , PA  09/02/2018 11:33 AM    [facility]

## 2018-09-01 NOTE — Telephone Encounter (Signed)
I left a voicemail for pt, reminding her of her appt on 09-02-18 with Fabian Sharp. Pt may says she needs to consent for Virtual Visit. We already have her consent.

## 2018-09-02 ENCOUNTER — Telehealth (INDEPENDENT_AMBULATORY_CARE_PROVIDER_SITE_OTHER): Payer: Medicare HMO | Admitting: Physician Assistant

## 2018-09-02 ENCOUNTER — Encounter: Payer: Self-pay | Admitting: Physician Assistant

## 2018-09-02 VITALS — BP 135/82 | HR 90 | Ht 63.0 in | Wt 160.0 lb

## 2018-09-02 DIAGNOSIS — I519 Heart disease, unspecified: Secondary | ICD-10-CM | POA: Diagnosis not present

## 2018-09-02 DIAGNOSIS — R002 Palpitations: Secondary | ICD-10-CM

## 2018-09-02 DIAGNOSIS — I5189 Other ill-defined heart diseases: Secondary | ICD-10-CM

## 2018-09-02 DIAGNOSIS — I10 Essential (primary) hypertension: Secondary | ICD-10-CM | POA: Diagnosis not present

## 2018-09-02 DIAGNOSIS — E785 Hyperlipidemia, unspecified: Secondary | ICD-10-CM

## 2018-09-02 NOTE — Patient Instructions (Signed)
Medication Instructions:  Angie Duke, PA recommends that you continue on your current medications as directed. Please refer to the Current Medication list given to you today.  If you need a refill on your cardiac medications before your next appointment, please call your pharmacy.   Follow-Up: At [facility], you and your health needs are our priority.  As part of our continuing mission to provide you with exceptional heart care, we have created designated Provider Care Teams.  These Care Teams include your primary Cardiologist (physician) and Advanced Practice Providers (APPs -  Physician Assistants and Nurse Practitioners) who all work together to provide you with the care you need, when you need it. You will need a follow up appointment in 12 months.  Please call our office 2 months in advance to schedule this appointment.  You may see Thomas Kelly, MD or one of the following Advanced Practice Providers on your designated Care Team: Hao Meng, PA-C . Angela Duke, PA-C . You will receive a reminder letter in the mail two months in advance. If you don't receive a letter, please call our office to schedule the follow-up appointment.  

## 2019-02-06 ENCOUNTER — Other Ambulatory Visit: Payer: Self-pay | Admitting: Cardiovascular Disease

## 2019-11-07 ENCOUNTER — Other Ambulatory Visit: Payer: Self-pay | Admitting: Cardiovascular Disease

## 2019-12-06 ENCOUNTER — Other Ambulatory Visit: Payer: Self-pay | Admitting: Cardiovascular Disease

## 2020-01-10 ENCOUNTER — Other Ambulatory Visit: Payer: Self-pay | Admitting: Cardiovascular Disease

## 2020-01-30 ENCOUNTER — Other Ambulatory Visit: Payer: Self-pay | Admitting: Cardiovascular Disease
